# Patient Record
Sex: Female | Born: 1935 | Race: White | Hispanic: No | Smoking: Current every day smoker
Health system: Southern US, Community
[De-identification: ages and names within clinical notes are randomized; demographics above are authoritative.]

## PROBLEM LIST (undated history)

## (undated) DIAGNOSIS — C3490 Malignant neoplasm of unspecified part of unspecified bronchus or lung: Secondary | ICD-10-CM

## (undated) DIAGNOSIS — I10 Essential (primary) hypertension: Secondary | ICD-10-CM

## (undated) HISTORY — DX: Malignant neoplasm of unspecified part of unspecified bronchus or lung: C34.90

---

## 2005-01-02 ENCOUNTER — Ambulatory Visit: Payer: Self-pay | Admitting: Internal Medicine

## 2005-10-15 ENCOUNTER — Ambulatory Visit: Payer: Self-pay | Admitting: Ophthalmology

## 2005-10-22 ENCOUNTER — Ambulatory Visit: Payer: Self-pay | Admitting: Ophthalmology

## 2005-12-13 ENCOUNTER — Ambulatory Visit: Payer: Self-pay | Admitting: Ophthalmology

## 2005-12-19 ENCOUNTER — Ambulatory Visit: Payer: Self-pay | Admitting: Ophthalmology

## 2006-02-19 ENCOUNTER — Ambulatory Visit: Payer: Self-pay | Admitting: Internal Medicine

## 2006-04-16 ENCOUNTER — Ambulatory Visit: Payer: Self-pay | Admitting: Gastroenterology

## 2007-02-21 ENCOUNTER — Ambulatory Visit: Payer: Self-pay | Admitting: Internal Medicine

## 2008-03-02 ENCOUNTER — Ambulatory Visit: Payer: Self-pay | Admitting: Internal Medicine

## 2009-03-18 ENCOUNTER — Ambulatory Visit: Payer: Self-pay | Admitting: Internal Medicine

## 2010-04-05 ENCOUNTER — Ambulatory Visit: Payer: Self-pay | Admitting: Internal Medicine

## 2011-05-03 ENCOUNTER — Ambulatory Visit: Payer: Self-pay | Admitting: Internal Medicine

## 2011-11-19 ENCOUNTER — Ambulatory Visit: Payer: Self-pay | Admitting: Gastroenterology

## 2012-03-21 ENCOUNTER — Ambulatory Visit: Payer: Self-pay | Admitting: Internal Medicine

## 2012-05-05 ENCOUNTER — Ambulatory Visit: Payer: Self-pay | Admitting: Internal Medicine

## 2012-08-11 ENCOUNTER — Other Ambulatory Visit: Payer: Self-pay | Admitting: Gastroenterology

## 2012-08-13 LAB — WBCS, STOOL

## 2012-10-01 ENCOUNTER — Ambulatory Visit: Payer: Self-pay | Admitting: Internal Medicine

## 2013-05-06 ENCOUNTER — Ambulatory Visit: Payer: Self-pay | Admitting: Internal Medicine

## 2013-10-02 ENCOUNTER — Ambulatory Visit: Payer: Self-pay | Admitting: Internal Medicine

## 2013-10-13 ENCOUNTER — Ambulatory Visit: Payer: Self-pay | Admitting: Internal Medicine

## 2013-10-29 ENCOUNTER — Ambulatory Visit: Payer: Self-pay | Admitting: Internal Medicine

## 2013-11-05 ENCOUNTER — Ambulatory Visit: Payer: Self-pay | Admitting: Internal Medicine

## 2013-11-16 ENCOUNTER — Ambulatory Visit: Payer: Self-pay | Admitting: Oncology

## 2013-11-16 LAB — COMPREHENSIVE METABOLIC PANEL
Albumin: 4 g/dL (ref 3.4–5.0)
Alkaline Phosphatase: 78 U/L
Anion Gap: 7 (ref 7–16)
BUN: 10 mg/dL (ref 7–18)
Bilirubin,Total: 0.4 mg/dL (ref 0.2–1.0)
CALCIUM: 9.2 mg/dL (ref 8.5–10.1)
CO2: 30 mmol/L (ref 21–32)
Chloride: 103 mmol/L (ref 98–107)
Creatinine: 1.03 mg/dL (ref 0.60–1.30)
EGFR (Non-African Amer.): 53 — ABNORMAL LOW
GLUCOSE: 118 mg/dL — AB (ref 65–99)
Osmolality: 280 (ref 275–301)
Potassium: 4.3 mmol/L (ref 3.5–5.1)
SGOT(AST): 25 U/L (ref 15–37)
SGPT (ALT): 29 U/L
SODIUM: 140 mmol/L (ref 136–145)
Total Protein: 7.6 g/dL (ref 6.4–8.2)

## 2013-11-16 LAB — CBC CANCER CENTER
BASOS ABS: 0.1 x10 3/mm (ref 0.0–0.1)
Basophil %: 0.7 %
Eosinophil #: 0.2 x10 3/mm (ref 0.0–0.7)
Eosinophil %: 2.3 %
HCT: 45.6 % (ref 35.0–47.0)
HGB: 15 g/dL (ref 12.0–16.0)
Lymphocyte #: 2.4 x10 3/mm (ref 1.0–3.6)
Lymphocyte %: 32.4 %
MCH: 30.5 pg (ref 26.0–34.0)
MCHC: 33 g/dL (ref 32.0–36.0)
MCV: 93 fL (ref 80–100)
Monocyte #: 0.6 x10 3/mm (ref 0.2–0.9)
Monocyte %: 8.3 %
NEUTROS ABS: 4.1 x10 3/mm (ref 1.4–6.5)
Neutrophil %: 56.3 %
Platelet: 307 x10 3/mm (ref 150–440)
RBC: 4.93 10*6/uL (ref 3.80–5.20)
RDW: 14.2 % (ref 11.5–14.5)
WBC: 7.3 x10 3/mm (ref 3.6–11.0)

## 2013-11-23 ENCOUNTER — Ambulatory Visit: Payer: Self-pay | Admitting: Vascular Surgery

## 2013-12-02 LAB — CBC CANCER CENTER
BASOS ABS: 0.1 x10 3/mm (ref 0.0–0.1)
BASOS PCT: 1.1 %
Eosinophil #: 0.2 x10 3/mm (ref 0.0–0.7)
Eosinophil %: 4.1 %
HCT: 42.5 % (ref 35.0–47.0)
HGB: 14.1 g/dL (ref 12.0–16.0)
Lymphocyte #: 1.6 x10 3/mm (ref 1.0–3.6)
Lymphocyte %: 29.7 %
MCH: 30.8 pg (ref 26.0–34.0)
MCHC: 33.1 g/dL (ref 32.0–36.0)
MCV: 93 fL (ref 80–100)
MONO ABS: 0.3 x10 3/mm (ref 0.2–0.9)
Monocyte %: 5.8 %
NEUTROS ABS: 3.2 x10 3/mm (ref 1.4–6.5)
Neutrophil %: 59.3 %
Platelet: 280 x10 3/mm (ref 150–440)
RBC: 4.57 10*6/uL (ref 3.80–5.20)
RDW: 13.5 % (ref 11.5–14.5)
WBC: 5.4 x10 3/mm (ref 3.6–11.0)

## 2013-12-08 ENCOUNTER — Ambulatory Visit: Payer: Self-pay | Admitting: Oncology

## 2013-12-09 LAB — CBC CANCER CENTER
Basophil #: 0 x10 3/mm (ref 0.0–0.1)
Basophil %: 0.9 %
Eosinophil #: 0.1 x10 3/mm (ref 0.0–0.7)
Eosinophil %: 2.5 %
HCT: 39.4 % (ref 35.0–47.0)
HGB: 13.1 g/dL (ref 12.0–16.0)
LYMPHS ABS: 1.5 x10 3/mm (ref 1.0–3.6)
Lymphocyte %: 31.9 %
MCH: 31.1 pg (ref 26.0–34.0)
MCHC: 33.3 g/dL (ref 32.0–36.0)
MCV: 94 fL (ref 80–100)
Monocyte #: 0.4 x10 3/mm (ref 0.2–0.9)
Monocyte %: 8.8 %
NEUTROS ABS: 2.7 x10 3/mm (ref 1.4–6.5)
Neutrophil %: 55.9 %
PLATELETS: 264 x10 3/mm (ref 150–440)
RBC: 4.21 10*6/uL (ref 3.80–5.20)
RDW: 13.3 % (ref 11.5–14.5)
WBC: 4.7 x10 3/mm (ref 3.6–11.0)

## 2013-12-16 LAB — CBC CANCER CENTER
BASOS PCT: 0.9 %
Basophil #: 0.1 x10 3/mm (ref 0.0–0.1)
EOS PCT: 1.7 %
Eosinophil #: 0.1 x10 3/mm (ref 0.0–0.7)
HCT: 42.1 % (ref 35.0–47.0)
HGB: 14.2 g/dL (ref 12.0–16.0)
LYMPHS ABS: 1.4 x10 3/mm (ref 1.0–3.6)
Lymphocyte %: 25 %
MCH: 31.5 pg (ref 26.0–34.0)
MCHC: 33.7 g/dL (ref 32.0–36.0)
MCV: 93 fL (ref 80–100)
MONOS PCT: 8.9 %
Monocyte #: 0.5 x10 3/mm (ref 0.2–0.9)
Neutrophil #: 3.6 x10 3/mm (ref 1.4–6.5)
Neutrophil %: 63.5 %
PLATELETS: 240 x10 3/mm (ref 150–440)
RBC: 4.51 10*6/uL (ref 3.80–5.20)
RDW: 14.2 % (ref 11.5–14.5)
WBC: 5.6 x10 3/mm (ref 3.6–11.0)

## 2013-12-16 LAB — COMPREHENSIVE METABOLIC PANEL
ALK PHOS: 75 U/L
ALT: 23 U/L
ANION GAP: 10 (ref 7–16)
AST: 22 U/L (ref 15–37)
Albumin: 3.5 g/dL (ref 3.4–5.0)
BILIRUBIN TOTAL: 0.4 mg/dL (ref 0.2–1.0)
BUN: 7 mg/dL (ref 7–18)
CALCIUM: 8.7 mg/dL (ref 8.5–10.1)
CHLORIDE: 102 mmol/L (ref 98–107)
Co2: 27 mmol/L (ref 21–32)
Creatinine: 0.96 mg/dL (ref 0.60–1.30)
EGFR (Non-African Amer.): 57 — ABNORMAL LOW
Glucose: 100 mg/dL — ABNORMAL HIGH (ref 65–99)
OSMOLALITY: 276 (ref 275–301)
POTASSIUM: 3.7 mmol/L (ref 3.5–5.1)
SODIUM: 139 mmol/L (ref 136–145)
Total Protein: 6.9 g/dL (ref 6.4–8.2)

## 2013-12-18 LAB — PATHOLOGY REPORT

## 2013-12-23 LAB — CBC CANCER CENTER
BASOS ABS: 0.1 x10 3/mm (ref 0.0–0.1)
Basophil %: 1.3 %
EOS ABS: 0.1 x10 3/mm (ref 0.0–0.7)
EOS PCT: 2.1 %
HCT: 39.7 % (ref 35.0–47.0)
HGB: 13 g/dL (ref 12.0–16.0)
LYMPHS ABS: 2.3 x10 3/mm (ref 1.0–3.6)
LYMPHS PCT: 38.5 %
MCH: 30.7 pg (ref 26.0–34.0)
MCHC: 32.9 g/dL (ref 32.0–36.0)
MCV: 93 fL (ref 80–100)
Monocyte #: 0.3 x10 3/mm (ref 0.2–0.9)
Monocyte %: 5 %
NEUTROS PCT: 53.1 %
Neutrophil #: 3.2 x10 3/mm (ref 1.4–6.5)
PLATELETS: 187 x10 3/mm (ref 150–440)
RBC: 4.25 10*6/uL (ref 3.80–5.20)
RDW: 13.9 % (ref 11.5–14.5)
WBC: 6 x10 3/mm (ref 3.6–11.0)

## 2014-01-06 LAB — COMPREHENSIVE METABOLIC PANEL
AST: 18 U/L (ref 15–37)
Albumin: 3.6 g/dL (ref 3.4–5.0)
Alkaline Phosphatase: 75 U/L
Anion Gap: 9 (ref 7–16)
BUN: 11 mg/dL (ref 7–18)
Bilirubin,Total: 0.3 mg/dL (ref 0.2–1.0)
CHLORIDE: 103 mmol/L (ref 98–107)
CREATININE: 1 mg/dL (ref 0.60–1.30)
Calcium, Total: 9 mg/dL (ref 8.5–10.1)
Co2: 28 mmol/L (ref 21–32)
EGFR (African American): >60
EGFR (Non-African Amer.): 57 — ABNORMAL LOW
Glucose: 102 mg/dL — ABNORMAL HIGH (ref 65–99)
OSMOLALITY: 279 (ref 275–301)
Potassium: 3.8 mmol/L (ref 3.5–5.1)
SGPT (ALT): 26 U/L
SODIUM: 140 mmol/L (ref 136–145)
Total Protein: 6.6 g/dL (ref 6.4–8.2)

## 2014-01-06 LAB — CBC CANCER CENTER
BASOS PCT: 0.9 %
Basophil #: 0 x10 3/mm (ref 0.0–0.1)
Eosinophil #: 0.1 x10 3/mm (ref 0.0–0.7)
Eosinophil %: 1.6 %
HCT: 38.9 % (ref 35.0–47.0)
HGB: 12.8 g/dL (ref 12.0–16.0)
LYMPHS PCT: 29.4 %
Lymphocyte #: 1.5 x10 3/mm (ref 1.0–3.6)
MCH: 31.3 pg (ref 26.0–34.0)
MCHC: 33 g/dL (ref 32.0–36.0)
MCV: 95 fL (ref 80–100)
Monocyte #: 0.5 x10 3/mm (ref 0.2–0.9)
Monocyte %: 10.6 %
Neutrophil #: 2.9 x10 3/mm (ref 1.4–6.5)
Neutrophil %: 57.5 %
Platelet: 195 x10 3/mm (ref 150–440)
RBC: 4.1 10*6/uL (ref 3.80–5.20)
RDW: 15.4 % — ABNORMAL HIGH (ref 11.5–14.5)
WBC: 5 x10 3/mm (ref 3.6–11.0)

## 2014-01-06 LAB — MAGNESIUM: Magnesium: 1.8 mg/dL

## 2014-01-07 ENCOUNTER — Ambulatory Visit: Payer: Self-pay | Admitting: Oncology

## 2014-01-13 LAB — CBC CANCER CENTER
BASOS ABS: 0 x10 3/mm (ref 0.0–0.1)
BASOS PCT: 1 %
EOS PCT: 1.9 %
Eosinophil #: 0.1 x10 3/mm (ref 0.0–0.7)
HCT: 35.8 % (ref 35.0–47.0)
HGB: 12.1 g/dL (ref 12.0–16.0)
Lymphocyte #: 2.1 x10 3/mm (ref 1.0–3.6)
Lymphocyte %: 45.5 %
MCH: 32.3 pg (ref 26.0–34.0)
MCHC: 33.9 g/dL (ref 32.0–36.0)
MCV: 95 fL (ref 80–100)
MONO ABS: 0.2 x10 3/mm (ref 0.2–0.9)
Monocyte %: 5.1 %
NEUTROS PCT: 46.5 %
Neutrophil #: 2.1 x10 3/mm (ref 1.4–6.5)
Platelet: 203 x10 3/mm (ref 150–440)
RBC: 3.75 10*6/uL — ABNORMAL LOW (ref 3.80–5.20)
RDW: 15.5 % — AB (ref 11.5–14.5)
WBC: 4.6 x10 3/mm (ref 3.6–11.0)

## 2014-01-28 ENCOUNTER — Ambulatory Visit: Payer: Self-pay | Admitting: Oncology

## 2014-02-02 LAB — CBC CANCER CENTER
Basophil #: 0 x10 3/mm (ref 0.0–0.1)
Basophil %: 1 %
Eosinophil #: 0.1 x10 3/mm (ref 0.0–0.7)
Eosinophil %: 1.5 %
HCT: 39 % (ref 35.0–47.0)
HGB: 13.1 g/dL (ref 12.0–16.0)
LYMPHS PCT: 29.9 %
Lymphocyte #: 1.3 x10 3/mm (ref 1.0–3.6)
MCH: 33.1 pg (ref 26.0–34.0)
MCHC: 33.7 g/dL (ref 32.0–36.0)
MCV: 98 fL (ref 80–100)
MONO ABS: 0.5 x10 3/mm (ref 0.2–0.9)
Monocyte %: 11.2 %
NEUTROS ABS: 2.5 x10 3/mm (ref 1.4–6.5)
NEUTROS PCT: 56.4 %
Platelet: 242 x10 3/mm (ref 150–440)
RBC: 3.96 10*6/uL (ref 3.80–5.20)
RDW: 18.3 % — ABNORMAL HIGH (ref 11.5–14.5)
WBC: 4.4 x10 3/mm (ref 3.6–11.0)

## 2014-02-02 LAB — COMPREHENSIVE METABOLIC PANEL
ALBUMIN: 3.7 g/dL (ref 3.4–5.0)
ALK PHOS: 77 U/L
Anion Gap: 7 (ref 7–16)
BILIRUBIN TOTAL: 0.5 mg/dL (ref 0.2–1.0)
BUN: 10 mg/dL (ref 7–18)
CHLORIDE: 103 mmol/L (ref 98–107)
Calcium, Total: 9.2 mg/dL (ref 8.5–10.1)
Co2: 30 mmol/L (ref 21–32)
Creatinine: 0.9 mg/dL (ref 0.60–1.30)
Glucose: 89 mg/dL (ref 65–99)
Osmolality: 278 (ref 275–301)
Potassium: 3.9 mmol/L (ref 3.5–5.1)
SGOT(AST): 20 U/L (ref 15–37)
SGPT (ALT): 24 U/L
Sodium: 140 mmol/L (ref 136–145)
Total Protein: 7 g/dL (ref 6.4–8.2)

## 2014-02-02 LAB — MAGNESIUM: Magnesium: 1.8 mg/dL

## 2014-02-07 ENCOUNTER — Ambulatory Visit: Payer: Self-pay | Admitting: Oncology

## 2014-02-09 LAB — CBC CANCER CENTER
BASOS PCT: 1 %
Basophil #: 0.1 x10 3/mm (ref 0.0–0.1)
EOS ABS: 0.1 x10 3/mm (ref 0.0–0.7)
Eosinophil %: 1.6 %
HCT: 33.6 % — AB (ref 35.0–47.0)
HGB: 11.2 g/dL — ABNORMAL LOW (ref 12.0–16.0)
LYMPHS PCT: 34.1 %
Lymphocyte #: 1.8 x10 3/mm (ref 1.0–3.6)
MCH: 33.1 pg (ref 26.0–34.0)
MCHC: 33.2 g/dL (ref 32.0–36.0)
MCV: 100 fL (ref 80–100)
MONO ABS: 0.3 x10 3/mm (ref 0.2–0.9)
MONOS PCT: 5.8 %
NEUTROS PCT: 57.5 %
Neutrophil #: 3 x10 3/mm (ref 1.4–6.5)
Platelet: 257 x10 3/mm (ref 150–440)
RBC: 3.38 10*6/uL — AB (ref 3.80–5.20)
RDW: 18.2 % — AB (ref 11.5–14.5)
WBC: 5.3 x10 3/mm (ref 3.6–11.0)

## 2014-02-23 LAB — CBC CANCER CENTER
Basophil #: 0 x10 3/mm (ref 0.0–0.1)
Basophil %: 1 %
EOS ABS: 0 x10 3/mm (ref 0.0–0.7)
Eosinophil %: 0.5 %
HCT: 37.1 % (ref 35.0–47.0)
HGB: 12.2 g/dL (ref 12.0–16.0)
LYMPHS PCT: 32.1 %
Lymphocyte #: 1.5 x10 3/mm (ref 1.0–3.6)
MCH: 33.5 pg (ref 26.0–34.0)
MCHC: 32.9 g/dL (ref 32.0–36.0)
MCV: 102 fL — AB (ref 80–100)
MONOS PCT: 10.3 %
Monocyte #: 0.5 x10 3/mm (ref 0.2–0.9)
NEUTROS ABS: 2.6 x10 3/mm (ref 1.4–6.5)
Neutrophil %: 56.1 %
Platelet: 159 x10 3/mm (ref 150–440)
RBC: 3.65 10*6/uL — AB (ref 3.80–5.20)
RDW: 18.8 % — ABNORMAL HIGH (ref 11.5–14.5)
WBC: 4.6 x10 3/mm (ref 3.6–11.0)

## 2014-02-23 LAB — COMPREHENSIVE METABOLIC PANEL
ANION GAP: 8 (ref 7–16)
Albumin: 3.6 g/dL (ref 3.4–5.0)
Alkaline Phosphatase: 72 U/L
BILIRUBIN TOTAL: 0.4 mg/dL (ref 0.2–1.0)
BUN: 8 mg/dL (ref 7–18)
CALCIUM: 9.3 mg/dL (ref 8.5–10.1)
CHLORIDE: 104 mmol/L (ref 98–107)
CO2: 30 mmol/L (ref 21–32)
CREATININE: 0.83 mg/dL (ref 0.60–1.30)
EGFR (African American): >60
Glucose: 86 mg/dL (ref 65–99)
OSMOLALITY: 281 (ref 275–301)
Potassium: 3.9 mmol/L (ref 3.5–5.1)
SGOT(AST): 17 U/L (ref 15–37)
SGPT (ALT): 22 U/L
SODIUM: 142 mmol/L (ref 136–145)
TOTAL PROTEIN: 6.7 g/dL (ref 6.4–8.2)

## 2014-03-02 LAB — CBC CANCER CENTER
BASOS ABS: 0.1 x10 3/mm (ref 0.0–0.1)
BASOS PCT: 1 %
Eosinophil #: 0.1 x10 3/mm (ref 0.0–0.7)
Eosinophil %: 1.1 %
HCT: 34.2 % — ABNORMAL LOW (ref 35.0–47.0)
HGB: 11.5 g/dL — ABNORMAL LOW (ref 12.0–16.0)
LYMPHS ABS: 1.7 x10 3/mm (ref 1.0–3.6)
LYMPHS PCT: 33.4 %
MCH: 34.3 pg — AB (ref 26.0–34.0)
MCHC: 33.6 g/dL (ref 32.0–36.0)
MCV: 102 fL — ABNORMAL HIGH (ref 80–100)
MONO ABS: 0.2 x10 3/mm (ref 0.2–0.9)
MONOS PCT: 4.5 %
NEUTROS ABS: 3.1 x10 3/mm (ref 1.4–6.5)
Neutrophil %: 60 %
PLATELETS: 146 x10 3/mm — AB (ref 150–440)
RBC: 3.34 10*6/uL — AB (ref 3.80–5.20)
RDW: 17.8 % — ABNORMAL HIGH (ref 11.5–14.5)
WBC: 5.2 x10 3/mm (ref 3.6–11.0)

## 2014-03-09 ENCOUNTER — Ambulatory Visit: Payer: Self-pay | Admitting: Oncology

## 2014-03-16 LAB — COMPREHENSIVE METABOLIC PANEL
ALBUMIN: 3.5 g/dL (ref 3.4–5.0)
ALK PHOS: 75 U/L
Anion Gap: 6 — ABNORMAL LOW (ref 7–16)
BILIRUBIN TOTAL: 0.4 mg/dL (ref 0.2–1.0)
BUN: 8 mg/dL (ref 7–18)
CALCIUM: 8.9 mg/dL (ref 8.5–10.1)
CHLORIDE: 107 mmol/L (ref 98–107)
CO2: 28 mmol/L (ref 21–32)
Creatinine: 0.9 mg/dL (ref 0.60–1.30)
EGFR (African American): >60
Glucose: 93 mg/dL (ref 65–99)
Osmolality: 279 (ref 275–301)
POTASSIUM: 3.3 mmol/L — AB (ref 3.5–5.1)
SGOT(AST): 19 U/L (ref 15–37)
SGPT (ALT): 22 U/L
SODIUM: 141 mmol/L (ref 136–145)
TOTAL PROTEIN: 6.6 g/dL (ref 6.4–8.2)

## 2014-03-16 LAB — CBC CANCER CENTER
Basophil #: 0 x10 3/mm (ref 0.0–0.1)
Basophil %: 0.7 %
EOS ABS: 0 x10 3/mm (ref 0.0–0.7)
EOS PCT: 0.6 %
HCT: 33.5 % — ABNORMAL LOW (ref 35.0–47.0)
HGB: 11.1 g/dL — AB (ref 12.0–16.0)
LYMPHS ABS: 1.1 x10 3/mm (ref 1.0–3.6)
Lymphocyte %: 33.6 %
MCH: 35.1 pg — ABNORMAL HIGH (ref 26.0–34.0)
MCHC: 33.1 g/dL (ref 32.0–36.0)
MCV: 106 fL — AB (ref 80–100)
MONO ABS: 0.5 x10 3/mm (ref 0.2–0.9)
Monocyte %: 13.8 %
Neutrophil #: 1.8 x10 3/mm (ref 1.4–6.5)
Neutrophil %: 51.3 %
PLATELETS: 155 x10 3/mm (ref 150–440)
RBC: 3.16 10*6/uL — ABNORMAL LOW (ref 3.80–5.20)
RDW: 17.9 % — ABNORMAL HIGH (ref 11.5–14.5)
WBC: 3.4 x10 3/mm — AB (ref 3.6–11.0)

## 2014-03-23 LAB — CBC CANCER CENTER
Basophil #: 0 x10 3/mm (ref 0.0–0.1)
Basophil %: 0.6 %
EOS ABS: 0.1 x10 3/mm (ref 0.0–0.7)
Eosinophil %: 1.3 %
HCT: 30.9 % — AB (ref 35.0–47.0)
HGB: 10.5 g/dL — AB (ref 12.0–16.0)
LYMPHS ABS: 1.4 x10 3/mm (ref 1.0–3.6)
Lymphocyte %: 32 %
MCH: 34.9 pg — AB (ref 26.0–34.0)
MCHC: 34 g/dL (ref 32.0–36.0)
MCV: 103 fL — ABNORMAL HIGH (ref 80–100)
MONOS PCT: 7.8 %
Monocyte #: 0.3 x10 3/mm (ref 0.2–0.9)
NEUTROS ABS: 2.6 x10 3/mm (ref 1.4–6.5)
Neutrophil %: 58.3 %
Platelet: 167 x10 3/mm (ref 150–440)
RBC: 3.01 10*6/uL — AB (ref 3.80–5.20)
RDW: 17 % — ABNORMAL HIGH (ref 11.5–14.5)
WBC: 4.4 x10 3/mm (ref 3.6–11.0)

## 2014-03-24 DIAGNOSIS — C787 Secondary malignant neoplasm of liver and intrahepatic bile duct: Secondary | ICD-10-CM | POA: Insufficient documentation

## 2014-03-24 DIAGNOSIS — C3410 Malignant neoplasm of upper lobe, unspecified bronchus or lung: Secondary | ICD-10-CM | POA: Insufficient documentation

## 2014-03-30 LAB — BASIC METABOLIC PANEL
Anion Gap: 8 (ref 7–16)
BUN: 8 mg/dL (ref 7–18)
CHLORIDE: 103 mmol/L (ref 98–107)
Calcium, Total: 8.8 mg/dL (ref 8.5–10.1)
Co2: 29 mmol/L (ref 21–32)
Creatinine: 0.8 mg/dL (ref 0.60–1.30)
EGFR (African American): >60
EGFR (Non-African Amer.): >60
Glucose: 89 mg/dL (ref 65–99)
Osmolality: 277 (ref 275–301)
POTASSIUM: 3.6 mmol/L (ref 3.5–5.1)
Sodium: 140 mmol/L (ref 136–145)

## 2014-03-30 LAB — HEPATIC FUNCTION PANEL A (ARMC)
ALT: 24 U/L
Albumin: 3.6 g/dL (ref 3.4–5.0)
Alkaline Phosphatase: 77 U/L
BILIRUBIN DIRECT: 0.1 mg/dL (ref 0.0–0.2)
Bilirubin,Total: 0.3 mg/dL (ref 0.2–1.0)
SGOT(AST): 19 U/L (ref 15–37)
Total Protein: 6.9 g/dL (ref 6.4–8.2)

## 2014-03-30 LAB — CBC CANCER CENTER
BASOS ABS: 0 x10 3/mm (ref 0.0–0.1)
BASOS PCT: 0.6 %
Eosinophil #: 0 x10 3/mm (ref 0.0–0.7)
Eosinophil %: 1.1 %
HCT: 33 % — ABNORMAL LOW (ref 35.0–47.0)
HGB: 11.2 g/dL — ABNORMAL LOW (ref 12.0–16.0)
Lymphocyte #: 1.5 x10 3/mm (ref 1.0–3.6)
Lymphocyte %: 48.5 %
MCH: 34.9 pg — AB (ref 26.0–34.0)
MCHC: 33.8 g/dL (ref 32.0–36.0)
MCV: 103 fL — ABNORMAL HIGH (ref 80–100)
Monocyte #: 0.3 x10 3/mm (ref 0.2–0.9)
Monocyte %: 10 %
Neutrophil #: 1.2 x10 3/mm — ABNORMAL LOW (ref 1.4–6.5)
Neutrophil %: 39.8 %
PLATELETS: 166 x10 3/mm (ref 150–440)
RBC: 3.2 10*6/uL — AB (ref 3.80–5.20)
RDW: 16.5 % — ABNORMAL HIGH (ref 11.5–14.5)
WBC: 3.1 x10 3/mm — ABNORMAL LOW (ref 3.6–11.0)

## 2014-04-06 ENCOUNTER — Ambulatory Visit: Payer: Self-pay | Admitting: Oncology

## 2014-04-09 ENCOUNTER — Ambulatory Visit: Payer: Self-pay | Admitting: Oncology

## 2014-04-13 LAB — CBC CANCER CENTER
BASOS PCT: 0.8 %
Basophil #: 0 x10 3/mm (ref 0.0–0.1)
EOS ABS: 0.1 x10 3/mm (ref 0.0–0.7)
Eosinophil %: 1.3 %
HCT: 37.6 % (ref 35.0–47.0)
HGB: 12.6 g/dL (ref 12.0–16.0)
LYMPHS ABS: 1.7 x10 3/mm (ref 1.0–3.6)
Lymphocyte %: 32.4 %
MCH: 35.2 pg — ABNORMAL HIGH (ref 26.0–34.0)
MCHC: 33.7 g/dL (ref 32.0–36.0)
MCV: 105 fL — ABNORMAL HIGH (ref 80–100)
MONO ABS: 0.6 x10 3/mm (ref 0.2–0.9)
Monocyte %: 10.9 %
Neutrophil #: 2.9 x10 3/mm (ref 1.4–6.5)
Neutrophil %: 54.6 %
Platelet: 268 x10 3/mm (ref 150–440)
RBC: 3.59 10*6/uL — ABNORMAL LOW (ref 3.80–5.20)
RDW: 16.3 % — AB (ref 11.5–14.5)
WBC: 5.2 x10 3/mm (ref 3.6–11.0)

## 2014-04-13 LAB — COMPREHENSIVE METABOLIC PANEL
ALK PHOS: 88 U/L
ALT: 21 U/L
ANION GAP: 7 (ref 7–16)
Albumin: 3.7 g/dL (ref 3.4–5.0)
BUN: 8 mg/dL (ref 7–18)
Bilirubin,Total: 0.3 mg/dL (ref 0.2–1.0)
CHLORIDE: 102 mmol/L (ref 98–107)
CREATININE: 0.81 mg/dL (ref 0.60–1.30)
Calcium, Total: 9 mg/dL (ref 8.5–10.1)
Co2: 31 mmol/L (ref 21–32)
EGFR (Non-African Amer.): >60
Glucose: 95 mg/dL (ref 65–99)
Osmolality: 278 (ref 275–301)
Potassium: 4.4 mmol/L (ref 3.5–5.1)
SGOT(AST): 17 U/L (ref 15–37)
SODIUM: 140 mmol/L (ref 136–145)
Total Protein: 7.3 g/dL (ref 6.4–8.2)

## 2014-04-13 LAB — MAGNESIUM: Magnesium: 1.8 mg/dL

## 2014-04-27 LAB — COMPREHENSIVE METABOLIC PANEL
Albumin: 3.7 g/dL (ref 3.4–5.0)
Alkaline Phosphatase: 86 U/L
Anion Gap: 8 (ref 7–16)
BUN: 5 mg/dL — ABNORMAL LOW (ref 7–18)
Bilirubin,Total: 0.4 mg/dL (ref 0.2–1.0)
CALCIUM: 8.7 mg/dL (ref 8.5–10.1)
CO2: 29 mmol/L (ref 21–32)
Chloride: 103 mmol/L (ref 98–107)
Creatinine: 0.88 mg/dL (ref 0.60–1.30)
EGFR (African American): >60
Glucose: 108 mg/dL — ABNORMAL HIGH (ref 65–99)
Osmolality: 277 (ref 275–301)
POTASSIUM: 3.3 mmol/L — AB (ref 3.5–5.1)
SGOT(AST): 19 U/L (ref 15–37)
SGPT (ALT): 21 U/L
SODIUM: 140 mmol/L (ref 136–145)
Total Protein: 7 g/dL (ref 6.4–8.2)

## 2014-04-27 LAB — CBC CANCER CENTER
BASOS PCT: 0.7 %
Basophil #: 0 x10 3/mm (ref 0.0–0.1)
EOS PCT: 1 %
Eosinophil #: 0.1 x10 3/mm (ref 0.0–0.7)
HCT: 38.8 % (ref 35.0–47.0)
HGB: 13.1 g/dL (ref 12.0–16.0)
LYMPHS ABS: 1.5 x10 3/mm (ref 1.0–3.6)
Lymphocyte %: 23 %
MCH: 34.8 pg — ABNORMAL HIGH (ref 26.0–34.0)
MCHC: 33.8 g/dL (ref 32.0–36.0)
MCV: 103 fL — AB (ref 80–100)
MONO ABS: 0.5 x10 3/mm (ref 0.2–0.9)
MONOS PCT: 8.5 %
Neutrophil #: 4.3 x10 3/mm (ref 1.4–6.5)
Neutrophil %: 66.8 %
Platelet: 240 x10 3/mm (ref 150–440)
RBC: 3.77 10*6/uL — AB (ref 3.80–5.20)
RDW: 15.5 % — ABNORMAL HIGH (ref 11.5–14.5)
WBC: 6.4 x10 3/mm (ref 3.6–11.0)

## 2014-05-10 ENCOUNTER — Ambulatory Visit: Payer: Self-pay | Admitting: Oncology

## 2014-06-08 ENCOUNTER — Ambulatory Visit
Admit: 2014-06-08 | Disposition: A | Discharge status: Home / Self Care | Payer: Self-pay | Attending: Oncology | Admitting: Oncology

## 2014-07-06 LAB — COMPREHENSIVE METABOLIC PANEL
AST: 22 U/L
Albumin: 4.2 g/dL
Alkaline Phosphatase: 64 U/L
Anion Gap: 3 — ABNORMAL LOW (ref 7–16)
BUN: 13 mg/dL
Bilirubin,Total: 0.6 mg/dL
CALCIUM: 8.9 mg/dL
Chloride: 104 mmol/L
Co2: 26 mmol/L
Creatinine: 0.91 mg/dL
EGFR (Non-African Amer.): >60
Glucose: 97 mg/dL
POTASSIUM: 3.9 mmol/L
SGPT (ALT): 15 U/L
Sodium: 133 mmol/L — ABNORMAL LOW
Total Protein: 7 g/dL

## 2014-07-06 LAB — CREATINE: Creat: 0.91

## 2014-07-06 LAB — CBC CANCER CENTER
BASOS PCT: 0.5 %
Basophil #: 0 x10 3/mm (ref 0.0–0.1)
EOS PCT: 0.7 %
Eosinophil #: 0 x10 3/mm (ref 0.0–0.7)
HCT: 41.8 % (ref 35.0–47.0)
HGB: 14 g/dL (ref 12.0–16.0)
LYMPHS PCT: 25.7 %
Lymphocyte #: 1.6 x10 3/mm (ref 1.0–3.6)
MCH: 31.4 pg (ref 26.0–34.0)
MCHC: 33.4 g/dL (ref 32.0–36.0)
MCV: 94 fL (ref 80–100)
MONO ABS: 0.9 x10 3/mm (ref 0.2–0.9)
MONOS PCT: 13.9 %
NEUTROS PCT: 59.2 %
Neutrophil #: 3.7 x10 3/mm (ref 1.4–6.5)
PLATELETS: 234 x10 3/mm (ref 150–440)
RBC: 4.45 10*6/uL (ref 3.80–5.20)
RDW: 13.4 % (ref 11.5–14.5)
WBC: 6.2 x10 3/mm (ref 3.6–11.0)

## 2014-07-06 LAB — TSH: THYROID STIMULATING HORM: 1.206 u[IU]/mL

## 2014-07-06 LAB — MAGNESIUM: Magnesium: 1.9 mg/dL

## 2014-07-09 ENCOUNTER — Ambulatory Visit
Admit: 2014-07-09 | Disposition: A | Discharge status: Home / Self Care | Payer: Self-pay | Attending: Oncology | Admitting: Oncology

## 2014-07-15 LAB — BASIC METABOLIC PANEL
Anion Gap: 5 — ABNORMAL LOW (ref 7–16)
BUN: 13 mg/dL
CALCIUM: 8.7 mg/dL — AB
CO2: 28 mmol/L
Chloride: 103 mmol/L
Creatinine: 0.83 mg/dL
EGFR (African American): >60
EGFR (Non-African Amer.): >60
Glucose: 94 mg/dL
Potassium: 3.1 mmol/L — ABNORMAL LOW
Sodium: 136 mmol/L

## 2014-07-15 LAB — CBC CANCER CENTER
Basophil #: 0 x10 3/mm (ref 0.0–0.1)
Basophil %: 0.2 %
EOS ABS: 0.1 x10 3/mm (ref 0.0–0.7)
Eosinophil %: 1.3 %
HCT: 41.3 % (ref 35.0–47.0)
HGB: 13.8 g/dL (ref 12.0–16.0)
LYMPHS PCT: 24.2 %
Lymphocyte #: 2.8 x10 3/mm (ref 1.0–3.6)
MCH: 30.9 pg (ref 26.0–34.0)
MCHC: 33.4 g/dL (ref 32.0–36.0)
MCV: 93 fL (ref 80–100)
MONO ABS: 0.9 x10 3/mm (ref 0.2–0.9)
Monocyte %: 8 %
NEUTROS PCT: 66.3 %
Neutrophil #: 7.6 x10 3/mm — ABNORMAL HIGH (ref 1.4–6.5)
Platelet: 281 x10 3/mm (ref 150–440)
RBC: 4.45 10*6/uL (ref 3.80–5.20)
RDW: 13.6 % (ref 11.5–14.5)
WBC: 11.5 x10 3/mm — ABNORMAL HIGH (ref 3.6–11.0)

## 2014-07-15 LAB — MAGNESIUM: Magnesium: 1.8 mg/dL

## 2014-07-21 LAB — COMPREHENSIVE METABOLIC PANEL
ALBUMIN: 3.6 g/dL
Alkaline Phosphatase: 60 U/L
Anion Gap: 5 — ABNORMAL LOW (ref 7–16)
BUN: 14 mg/dL
Bilirubin,Total: 0.5 mg/dL
CALCIUM: 8.7 mg/dL — AB
CO2: 27 mmol/L
Chloride: 101 mmol/L
Creatinine: 1.01 mg/dL — ABNORMAL HIGH
EGFR (Non-African Amer.): 54 — ABNORMAL LOW
GLUCOSE: 105 mg/dL — AB
POTASSIUM: 3.4 mmol/L — AB
SGOT(AST): 17 U/L
SGPT (ALT): 16 U/L
SODIUM: 133 mmol/L — AB
Total Protein: 6.4 g/dL — ABNORMAL LOW

## 2014-07-21 LAB — CBC CANCER CENTER
BASOS ABS: 0.1 x10 3/mm (ref 0.0–0.1)
Basophil %: 0.7 %
EOS ABS: 0.1 x10 3/mm (ref 0.0–0.7)
Eosinophil %: 1.2 %
HCT: 42.9 % (ref 35.0–47.0)
HGB: 14.3 g/dL (ref 12.0–16.0)
LYMPHS ABS: 2.5 x10 3/mm (ref 1.0–3.6)
Lymphocyte %: 22.4 %
MCH: 30.9 pg (ref 26.0–34.0)
MCHC: 33.2 g/dL (ref 32.0–36.0)
MCV: 93 fL (ref 80–100)
MONO ABS: 0.7 x10 3/mm (ref 0.2–0.9)
Monocyte %: 6.3 %
NEUTROS ABS: 7.8 x10 3/mm — AB (ref 1.4–6.5)
Neutrophil %: 69.4 %
PLATELETS: 278 x10 3/mm (ref 150–440)
RBC: 4.62 10*6/uL (ref 3.80–5.20)
RDW: 13.8 % (ref 11.5–14.5)
WBC: 11.2 x10 3/mm — ABNORMAL HIGH (ref 3.6–11.0)

## 2014-07-21 LAB — MAGNESIUM: MAGNESIUM: 1.8 mg/dL

## 2014-07-22 LAB — CLOSTRIDIUM DIFFICILE(ARMC)

## 2014-07-28 LAB — COMPREHENSIVE METABOLIC PANEL
AST: 25 U/L
Albumin: 3.9 g/dL
Alkaline Phosphatase: 68 U/L
Anion Gap: 8 (ref 7–16)
BUN: 16 mg/dL
Bilirubin,Total: 0.4 mg/dL
CALCIUM: 9.5 mg/dL
CHLORIDE: 102 mmol/L
Co2: 29 mmol/L
Creatinine: 0.96 mg/dL
EGFR (African American): >60
GFR CALC NON AF AMER: 57 — AB
GLUCOSE: 92 mg/dL
Potassium: 4 mmol/L
SGPT (ALT): 19 U/L
SODIUM: 139 mmol/L
Total Protein: 6.6 g/dL

## 2014-07-28 LAB — CBC CANCER CENTER
BASOS PCT: 0.1 %
Basophil #: 0 x10 3/mm (ref 0.0–0.1)
EOS ABS: 0 x10 3/mm (ref 0.0–0.7)
Eosinophil %: 0 %
HCT: 42.9 % (ref 35.0–47.0)
HGB: 14.3 g/dL (ref 12.0–16.0)
LYMPHS ABS: 0.6 x10 3/mm — AB (ref 1.0–3.6)
LYMPHS PCT: 5.6 %
MCH: 30.6 pg (ref 26.0–34.0)
MCHC: 33.2 g/dL (ref 32.0–36.0)
MCV: 92 fL (ref 80–100)
MONO ABS: 0.1 x10 3/mm — AB (ref 0.2–0.9)
Monocyte %: 1.1 %
NEUTROS ABS: 10.7 x10 3/mm — AB (ref 1.4–6.5)
Neutrophil %: 93.2 %
Platelet: 287 x10 3/mm (ref 150–440)
RBC: 4.66 10*6/uL (ref 3.80–5.20)
RDW: 14.4 % (ref 11.5–14.5)
WBC: 11.5 x10 3/mm — ABNORMAL HIGH (ref 3.6–11.0)

## 2014-07-28 LAB — MAGNESIUM: Magnesium: 1.9 mg/dL

## 2014-07-31 NOTE — Op Note (Signed)
PATIENT NAME:  Jill Richards, Jill Richards MR#:  [id number] DATE OF BIRTH:  [birth year]  DATE OF PROCEDURE:  11/23/2013   PREOPERATIVE DIAGNOSIS: Lung cancer with poor venous access.   POSTOPERATIVE DIAGNOSIS: Lung cancer with poor venous access.   PROCEDURES:  1.  Ultrasound guidance for vascular access, right internal jugular vein.  2.  Fluoroscopic guidance for placement of catheter.  3.  Placement of CT compatible Port-A-Cath, right internal jugular vein.   SURGEON:  Algernon Huxley, MD  ANESTHESIA:  Local with moderate conscious sedation.   FLUOROSCOPY TIME:  Less than 1 minute.   CONTRAST:  Zero.   ESTIMATED BLOOD LOSS:  Minimal.   INDICATION FOR PROCEDURE:  A 79 year old white female with lung cancer, who needs a Port-A-Cath for chemotherapy. She begins her treatments later this week. Risks and benefits were discussed. Informed consent was obtained.   DESCRIPTION OF THE PROCEDURE:  The patient was brought to the vascular and interventional radiology suite. The right neck and chest were sterilely prepped and draped, and a sterile surgical field was created. Ultrasound was used to help visualize a patent right internal jugular vein. This was then accessed under direct ultrasound guidance without difficulty with a Seldinger needle and a permanent image was recorded. A J-wire was placed. After skin nick and dilatation, the peel-away sheath was then placed over the wire. I then anesthetized an area under the clavicle approximately 2 fingerbreadths. A transverse incision was created and an inferior pocket was created with electrocautery and blunt dissection. The port was then brought onto the field, placed into the pocket and secured to the chest wall with 2 Prolene sutures. The catheter was connected to the port and tunneled from the subclavicular incision to the access site. Fluoroscopic guidance was used to cut the catheter to an appropriate length. The catheter was then placed through the  peel-away sheath and the peel-away sheath was removed. The catheter tip was parked in excellent location in the distal superior vena cava just above the right atrium.  The pocket was then irrigated with antibiotic-impregnated saline and the wound was closed with a running 3-0 Vicryl and a 4-0 Monocryl. The access incision was closed with a single 4-0 Monocryl. The Huber needle was used to withdraw blood and flush the port with heparinized saline. Dermabond was then placed as a dressing. The patient tolerated the procedure well and was taken to the recovery room in stable condition.      ____________________________ Algernon Huxley, MD jsd:DT D: 11/23/2013 12:13:11 ET T: 11/23/2013 12:41:34 ET JOB#: [id number]  cc: Algernon Huxley, MD, <Dictator> Algernon Huxley MD ELECTRONICALLY SIGNED 11/23/2013 14:42

## 2014-08-13 ENCOUNTER — Other Ambulatory Visit: Payer: Self-pay | Admitting: *Deleted

## 2014-08-13 DIAGNOSIS — C3490 Malignant neoplasm of unspecified part of unspecified bronchus or lung: Secondary | ICD-10-CM

## 2014-08-18 ENCOUNTER — Encounter: Payer: Self-pay | Admitting: Oncology

## 2014-08-18 ENCOUNTER — Inpatient Hospital Stay (HOSPITAL_BASED_OUTPATIENT_CLINIC_OR_DEPARTMENT_OTHER): Payer: Medicare Other | Admitting: Oncology

## 2014-08-18 ENCOUNTER — Encounter (INDEPENDENT_AMBULATORY_CARE_PROVIDER_SITE_OTHER): Payer: Self-pay

## 2014-08-18 ENCOUNTER — Inpatient Hospital Stay: Payer: Medicare Other | Attending: Oncology

## 2014-08-18 ENCOUNTER — Inpatient Hospital Stay: Payer: Medicare Other

## 2014-08-18 VITALS — BP 149/80 | HR 91 | Temp 95.4°F | Wt 159.6 lb

## 2014-08-18 VITALS — BP 113/72 | HR 80 | Temp 96.9°F | Resp 20

## 2014-08-18 DIAGNOSIS — C771 Secondary and unspecified malignant neoplasm of intrathoracic lymph nodes: Secondary | ICD-10-CM | POA: Insufficient documentation

## 2014-08-18 DIAGNOSIS — C3411 Malignant neoplasm of upper lobe, right bronchus or lung: Secondary | ICD-10-CM | POA: Insufficient documentation

## 2014-08-18 DIAGNOSIS — Z7952 Long term (current) use of systemic steroids: Secondary | ICD-10-CM | POA: Insufficient documentation

## 2014-08-18 DIAGNOSIS — Z5111 Encounter for antineoplastic chemotherapy: Secondary | ICD-10-CM | POA: Diagnosis not present

## 2014-08-18 DIAGNOSIS — R197 Diarrhea, unspecified: Secondary | ICD-10-CM | POA: Diagnosis not present

## 2014-08-18 DIAGNOSIS — Z79899 Other long term (current) drug therapy: Secondary | ICD-10-CM | POA: Diagnosis not present

## 2014-08-18 DIAGNOSIS — F17210 Nicotine dependence, cigarettes, uncomplicated: Secondary | ICD-10-CM

## 2014-08-18 DIAGNOSIS — E039 Hypothyroidism, unspecified: Secondary | ICD-10-CM | POA: Insufficient documentation

## 2014-08-18 DIAGNOSIS — E785 Hyperlipidemia, unspecified: Secondary | ICD-10-CM | POA: Insufficient documentation

## 2014-08-18 DIAGNOSIS — C3430 Malignant neoplasm of lower lobe, unspecified bronchus or lung: Secondary | ICD-10-CM

## 2014-08-18 DIAGNOSIS — J449 Chronic obstructive pulmonary disease, unspecified: Secondary | ICD-10-CM | POA: Insufficient documentation

## 2014-08-18 DIAGNOSIS — C787 Secondary malignant neoplasm of liver and intrahepatic bile duct: Secondary | ICD-10-CM

## 2014-08-18 DIAGNOSIS — C3490 Malignant neoplasm of unspecified part of unspecified bronchus or lung: Secondary | ICD-10-CM

## 2014-08-18 DIAGNOSIS — Z800 Family history of malignant neoplasm of digestive organs: Secondary | ICD-10-CM | POA: Insufficient documentation

## 2014-08-18 DIAGNOSIS — C3412 Malignant neoplasm of upper lobe, left bronchus or lung: Secondary | ICD-10-CM

## 2014-08-18 DIAGNOSIS — M1990 Unspecified osteoarthritis, unspecified site: Secondary | ICD-10-CM | POA: Insufficient documentation

## 2014-08-18 DIAGNOSIS — I10 Essential (primary) hypertension: Secondary | ICD-10-CM | POA: Insufficient documentation

## 2014-08-18 DIAGNOSIS — Z09 Encounter for follow-up examination after completed treatment for conditions other than malignant neoplasm: Secondary | ICD-10-CM

## 2014-08-18 LAB — COMPREHENSIVE METABOLIC PANEL
ALT: 21 U/L (ref 14–54)
ANION GAP: 4 — AB (ref 5–15)
AST: 30 U/L (ref 15–41)
Albumin: 3.8 g/dL (ref 3.5–5.0)
Alkaline Phosphatase: 63 U/L (ref 38–126)
BILIRUBIN TOTAL: 0.5 mg/dL (ref 0.3–1.2)
BUN: 7 mg/dL (ref 6–20)
CALCIUM: 8.7 mg/dL — AB (ref 8.9–10.3)
CHLORIDE: 101 mmol/L (ref 101–111)
CO2: 29 mmol/L (ref 22–32)
CREATININE: 0.72 mg/dL (ref 0.44–1.00)
GFR calc Af Amer: >60 mL/min (ref >60)
Glucose, Bld: 94 mg/dL (ref 65–99)
Potassium: 3.5 mmol/L (ref 3.5–5.1)
SODIUM: 134 mmol/L — AB (ref 135–145)
Total Protein: 6.7 g/dL (ref 6.5–8.1)

## 2014-08-18 LAB — CBC WITH DIFFERENTIAL/PLATELET
Basophils Absolute: 0 10*3/uL (ref 0–0.1)
Basophils Relative: 1 %
EOS ABS: 0.1 10*3/uL (ref 0–0.7)
Eosinophils Relative: 2 %
HCT: 41 % (ref 35.0–47.0)
Hemoglobin: 13.8 g/dL (ref 12.0–16.0)
LYMPHS ABS: 1.3 10*3/uL (ref 1.0–3.6)
LYMPHS PCT: 18 %
MCH: 30.6 pg (ref 26.0–34.0)
MCHC: 33.5 g/dL (ref 32.0–36.0)
MCV: 91.2 fL (ref 80.0–100.0)
Monocytes Absolute: 0.6 10*3/uL (ref 0.2–0.9)
Monocytes Relative: 8 %
NEUTROS PCT: 71 %
Neutro Abs: 5.2 10*3/uL (ref 1.4–6.5)
Platelets: 241 10*3/uL (ref 150–440)
RBC: 4.5 MIL/uL (ref 3.80–5.20)
RDW: 14.6 % — ABNORMAL HIGH (ref 11.5–14.5)
WBC: 7.3 10*3/uL (ref 3.6–11.0)

## 2014-08-18 LAB — MAGNESIUM: Magnesium: 1.9 mg/dL (ref 1.7–2.4)

## 2014-08-18 MED ORDER — SODIUM CHLORIDE 0.9 % IJ SOLN
10.0000 mL | INTRAMUSCULAR | Status: DC | PRN
  Administered 2014-08-18: 10 mL via INTRAVENOUS
  Filled 2014-08-18: qty 10

## 2014-08-18 MED ORDER — HEPARIN SOD (PORK) LOCK FLUSH 100 UNIT/ML IV SOLN
500.0000 [IU] | Freq: Once | INTRAVENOUS | Status: AC | PRN
  Administered 2014-08-18: 500 [IU]
  Filled 2014-08-18: qty 5

## 2014-08-18 MED ORDER — SODIUM CHLORIDE 0.9 % IV SOLN
200.0000 mg | Freq: Once | INTRAVENOUS | Status: AC
  Administered 2014-08-18: 200 mg via INTRAVENOUS
  Filled 2014-08-18: qty 20

## 2014-08-18 NOTE — Progress Notes (Signed)
Jill Richards @ [facility] Telephone:(336) [phone]  Fax:(336) [place]: 1935/11/19  MR#: [id number]  YWV#:[id number]  Patient Care Team: Tama High III as PCP - General (Internal Medicine)  CHIEF COMPLAINT:  Chief Complaint  Patient presents with  . Follow-up    Oncology History   Non-small cell carcinoma of lung T1 N2 M1stage IV diseasePET scan is consistent with one small lesion in the T11 as well as suspected lesion in liver (diagnoses was in July of 2015) right upper lobe tumor with metastases to the left liver and mediastinal lymph node  MRI of liver conforms metastases to the liver.  MRI of thoracic spine raises possibility of metastases to the spine however this is inconclusive study.  (August, 2015) 2.  Patient was started on carboplatinum and Taxol started on November 25, 2013. 6 cycle of chemotherapy with carboplatinum and Taxol.  Last treatment was March 16, 2014 7.because of progressing disease on CT scan patient has been started on NIVO   in  February of 2016     Cancer, metastatic to liver   03/24/2014 Initial Diagnosis Cancer, metastatic to liver    Cancer of lung, upper lobe   03/24/2014 Initial Diagnosis Cancer of lung, upper lobe    No flowsheet data found.  INTERVAL HISTORY: 79 year old lady with history of carcinoma of lung stage IV disease.  Recurrent and progressive disease on chemotherapy.  Patient has been on NIVOLULAMAB because of diarrhea was holding chemotherapy.  Diarrhea is improved now patient is off prednisone.  Did not take any Imodium.  No abdominal pain.  Has gained weight.  Appetite is improving.  REVIEW OF SYSTEMS:   GENERAL:  Feels good.  Active.  No fevers, sweats or weight loss. PERFORMANCE STATUS (ECOG):  1 HEENT:  No visual changes, runny nose, sore throat, mouth sores or tenderness. Lungs: No shortness of breath or cough.  No hemoptysis. Cardiac:  No chest pain, palpitations, orthopnea, or PND. GI:  No  nausea, vomiting, diarrhea, constipation, melena or hematochezia. GU:  No urgency, frequency, dysuria, or hematuria. Musculoskeletal:  No back pain.  No joint pain.  No muscle tenderness. Extremities:  No pain or swelling. Skin:  No rashes or skin changes. Neuro:  No headache, numbness or weakness, balance or coordination issues. Endocrine:  No diabetes, thyroid issues, hot flashes or night sweats. Psych:  No mood changes, depression or anxiety. Pain:  No focal pain. Review of systems:  All other systems reviewed and found to be negative.  As per HPI. Otherwise, a complete review of systems is negatve.  PAST MEDICAL HISTORY: Past Medical History  Diagnosis Date  . Lung cancer non small cell    PAST SURGICAL HISTORY:  has been reviewed from the previous note  FAMILY HISTORY no family history of colon cancer or breast cancer Has been reviewed from the previous note       ADVANCED DIRECTIVES:  Patient does not have any living will.  Educational material is being given   HEALTH MAINTENANCE: History  Substance Use Topics  . Smoking status: Current Every Day Smoker -- 0.50 packs/day for 60 years  . Smokeless tobacco: Not on file  . Alcohol Use: Not on file      Allergies  Allergen Reactions  . Codeine Other (See Comments) and Nausea Only    GI Distress  . Hydrochlorothiazide Other (See Comments)    hypercalcemia  . Prednisone Swelling    Current Outpatient Prescriptions  Medication Sig  Dispense Refill  . albuterol (PROVENTIL HFA;VENTOLIN HFA) 108 (90 BASE) MCG/ACT inhaler Inhale 2 puffs into the lungs every 6 (six) hours as needed for wheezing or shortness of breath.    Marland Kitchen amLODipine (NORVASC) 10 MG tablet Take 10 mg by mouth daily.    . cetirizine (ZYRTEC) 10 MG tablet Take 10 mg by mouth daily.    . Cholecalciferol (VITAMIN D3) 2000 UNITS TABS Take 1 capsule by mouth daily.    . GuaiFENesin (MUCUS RELIEF ADULT PO) Take 1 tablet by mouth daily as needed.    Marland Kitchen  levothyroxine (SYNTHROID, LEVOTHROID) 125 MCG tablet Take 125 mcg by mouth daily before breakfast.    . Multiple Vitamin (MULTIVITAMIN WITH MINERALS) TABS tablet Take 1 tablet by mouth daily.    . Probiotic Product (PROBIOTIC FORMULA PO) Take 1 tablet by mouth daily.    . ondansetron (ZOFRAN) 4 MG tablet Take 4 mg by mouth every 6 (six) hours as needed for nausea or vomiting.    . potassium chloride SA (K-DUR,KLOR-CON) 20 MEQ tablet Take 20 mEq by mouth daily.    . predniSONE (DELTASONE) 50 MG tablet Take 50 mg by mouth daily with breakfast.     No current facility-administered medications for this visit.   Facility-Administered Medications Ordered in Other Visits  Medication Dose Route Frequency Provider Last Rate Last Dose  . sodium chloride 0.9 % injection 10 mL  10 mL Intravenous PRN Forest Gleason, MD   10 mL at 08/18/14 1000    OBJECTIVE:  Filed Vitals:   08/18/14 1045  BP: 149/80  Pulse: 91  Temp: 95.4 F (35.2 C)     Body mass index is 29.37 kg/(m^2).    ECOG FS:1 - Symptomatic but completely ambulatory  PHYSICAL EXAM: Goal status: Performance status is good.  Patient has not lost significant weight HEENT: No evidence of stomatitis.  And alopecia Sclera and conjunctivae :: No jaundice.   pale looking . Lungs: Air  entry equal on both sides.  No rhonchi.  No rales.  Cardiac: Heart sounds are normal.  No pericardial rub.  No murmur. Lymphatic system: Cervical, axillary, inguinal, lymph nodes not palpable GI: Abdomen is soft.  No ascites.  Liver spleen not palpable.  No tenderness.  Bowel sounds are within normal limit Lower extremity: No edema Neurological system: Higher functions, cranial nerves intact No evidence of peripheral neuropathy. Skin: No rash.  No ecchymosis..  Target lesion:   LAB RESULTS:  Infusion on 08/18/2014  Component Date Value Ref Range Status  . WBC 08/18/2014 7.3  3.6 - 11.0 K/uL Final  . RBC 08/18/2014 4.50  3.80 - 5.20 MIL/uL Final  .  Hemoglobin 08/18/2014 13.8  12.0 - 16.0 g/dL Final  . HCT 08/18/2014 41.0  35.0 - 47.0 % Final  . MCV 08/18/2014 91.2  80.0 - 100.0 fL Final  . MCH 08/18/2014 30.6  26.0 - 34.0 pg Final  . MCHC 08/18/2014 33.5  32.0 - 36.0 g/dL Final  . RDW 08/18/2014 14.6* 11.5 - 14.5 % Final  . Platelets 08/18/2014 241  150 - 440 K/uL Final  . Neutrophils Relative % 08/18/2014 71   Final  . Neutro Abs 08/18/2014 5.2  1.4 - 6.5 K/uL Final  . Lymphocytes Relative 08/18/2014 18   Final  . Lymphs Abs 08/18/2014 1.3  1.0 - 3.6 K/uL Final  . Monocytes Relative 08/18/2014 8   Final  . Monocytes Absolute 08/18/2014 0.6  0.2 - 0.9 K/uL Final  . Eosinophils Relative 08/18/2014  2   Final  . Eosinophils Absolute 08/18/2014 0.1  0 - 0.7 K/uL Final  . Basophils Relative 08/18/2014 1   Final  . Basophils Absolute 08/18/2014 0.0  0 - 0.1 K/uL Final  . Sodium 08/18/2014 134* 135 - 145 mmol/L Final  . Potassium 08/18/2014 3.5  3.5 - 5.1 mmol/L Final  . Chloride 08/18/2014 101  101 - 111 mmol/L Final  . CO2 08/18/2014 29  22 - 32 mmol/L Final  . Glucose, Bld 08/18/2014 94  65 - 99 mg/dL Final  . BUN 08/18/2014 7  6 - 20 mg/dL Final  . Creatinine, Ser 08/18/2014 0.72  0.44 - 1.00 mg/dL Final  . Calcium 08/18/2014 8.7* 8.9 - 10.3 mg/dL Final  . Total Protein 08/18/2014 6.7  6.5 - 8.1 g/dL Final  . Albumin 08/18/2014 3.8  3.5 - 5.0 g/dL Final  . AST 08/18/2014 30  15 - 41 U/L Final  . ALT 08/18/2014 21  14 - 54 U/L Final  . Alkaline Phosphatase 08/18/2014 63  38 - 126 U/L Final  . Total Bilirubin 08/18/2014 0.5  0.3 - 1.2 mg/dL Final  . GFR calc non Af Amer 08/18/2014 >60  >60 mL/min Final  . GFR calc Af Amer 08/18/2014 >60  >60 mL/min Final   Comment: (NOTE) The eGFR has been calculated using the CKD EPI equation. This calculation has not been validated in all clinical situations. eGFR's persistently <60 mL/min signify possible Chronic Kidney Disease.   . Anion gap 08/18/2014 4* 5 - 15 Final  . Magnesium  08/18/2014 1.9  1.7 - 2.4 mg/dL Final    No results found for: LABCA2  No results found for: CA199  No results found for: CA125  No results found for: CEA  No results found for: PSA  Infusion on 08/18/2014  Component Date Value Ref Range Status  . WBC 08/18/2014 7.3  3.6 - 11.0 K/uL Final  . RBC 08/18/2014 4.50  3.80 - 5.20 MIL/uL Final  . Hemoglobin 08/18/2014 13.8  12.0 - 16.0 g/dL Final  . HCT 08/18/2014 41.0  35.0 - 47.0 % Final  . MCV 08/18/2014 91.2  80.0 - 100.0 fL Final  . MCH 08/18/2014 30.6  26.0 - 34.0 pg Final  . MCHC 08/18/2014 33.5  32.0 - 36.0 g/dL Final  . RDW 08/18/2014 14.6* 11.5 - 14.5 % Final  . Platelets 08/18/2014 241  150 - 440 K/uL Final  . Neutrophils Relative % 08/18/2014 71   Final  . Neutro Abs 08/18/2014 5.2  1.4 - 6.5 K/uL Final  . Lymphocytes Relative 08/18/2014 18   Final  . Lymphs Abs 08/18/2014 1.3  1.0 - 3.6 K/uL Final  . Monocytes Relative 08/18/2014 8   Final  . Monocytes Absolute 08/18/2014 0.6  0.2 - 0.9 K/uL Final  . Eosinophils Relative 08/18/2014 2   Final  . Eosinophils Absolute 08/18/2014 0.1  0 - 0.7 K/uL Final  . Basophils Relative 08/18/2014 1   Final  . Basophils Absolute 08/18/2014 0.0  0 - 0.1 K/uL Final  . Sodium 08/18/2014 134* 135 - 145 mmol/L Final  . Potassium 08/18/2014 3.5  3.5 - 5.1 mmol/L Final  . Chloride 08/18/2014 101  101 - 111 mmol/L Final  . CO2 08/18/2014 29  22 - 32 mmol/L Final  . Glucose, Bld 08/18/2014 94  65 - 99 mg/dL Final  . BUN 08/18/2014 7  6 - 20 mg/dL Final  . Creatinine, Ser 08/18/2014 0.72  0.44 - 1.00 mg/dL Final  .  Calcium 08/18/2014 8.7* 8.9 - 10.3 mg/dL Final  . Total Protein 08/18/2014 6.7  6.5 - 8.1 g/dL Final  . Albumin 08/18/2014 3.8  3.5 - 5.0 g/dL Final  . AST 08/18/2014 30  15 - 41 U/L Final  . ALT 08/18/2014 21  14 - 54 U/L Final  . Alkaline Phosphatase 08/18/2014 63  38 - 126 U/L Final  . Total Bilirubin 08/18/2014 0.5  0.3 - 1.2 mg/dL Final  . GFR calc non Af Amer 08/18/2014  >60  >60 mL/min Final  . GFR calc Af Amer 08/18/2014 >60  >60 mL/min Final   Comment: (NOTE) The eGFR has been calculated using the CKD EPI equation. This calculation has not been validated in all clinical situations. eGFR's persistently <60 mL/min signify possible Chronic Kidney Disease.   . Anion gap 08/18/2014 4* 5 - 15 Final  . Magnesium 08/18/2014 1.9  1.7 - 2.4 mg/dL Final    (this displays the last labs from the last 3 days)    Lab Results  Component Value Date   RBC 4.50 08/18/2014     ASSESSMENT: Stage IV non-small cell carcinoma of lung in a patient continues to smoke. Diarrhea has improved patient is off prednisone for a week did not take any Imodium.  MEDICAL DECISION MAKING:  All lab data has been reviewed. Will initiate chemotherapy today and in 2 weeks and patient was instructed to call me if develops any diarrhea.  In that case change in the treatment may be necessary  Consider repeating PET scan for restaging in next few months  Patient expressed understanding and was in agreement with this plan. She also understands that She can call clinic at any time with any questions, concerns, or complaints.    Cancer of lung, upper lobe   Staging form: Lung, AJCC 7th Edition     Clinical: Stage IV (T1, N2, M1b) - Jill Griffon, MD   08/18/2014 11:09 AM

## 2014-08-19 ENCOUNTER — Telehealth: Payer: Self-pay | Admitting: *Deleted

## 2014-08-19 DIAGNOSIS — R197 Diarrhea, unspecified: Secondary | ICD-10-CM

## 2014-08-19 MED ORDER — PREDNISONE 5 MG PO TABS: ORAL_TABLET | ORAL | Status: DC

## 2014-08-19 NOTE — Telephone Encounter (Signed)
Per Dr Oliva Bustard, pt informed to take Imodium AD today and if does not control diarrhea, prednisone ta;er has been sent ot pharmacy and she is to start taking it. If diarrhea continues, she is to come in next week to be seen. She will see md on her next chemo appt . Pt verbalizes understanding adn said she will have her daughter go ahead and pickup the rx to have on hand.

## 2014-08-19 NOTE — Telephone Encounter (Signed)
Diarrhea is back since chemo yesterday

## 2014-08-27 ENCOUNTER — Other Ambulatory Visit: Payer: Self-pay | Admitting: *Deleted

## 2014-08-27 DIAGNOSIS — C3412 Malignant neoplasm of upper lobe, left bronchus or lung: Secondary | ICD-10-CM

## 2014-09-01 ENCOUNTER — Other Ambulatory Visit: Payer: Medicare Other

## 2014-09-01 ENCOUNTER — Inpatient Hospital Stay: Payer: Medicare Other

## 2014-09-01 ENCOUNTER — Inpatient Hospital Stay (HOSPITAL_BASED_OUTPATIENT_CLINIC_OR_DEPARTMENT_OTHER): Payer: Medicare Other | Admitting: Oncology

## 2014-09-01 ENCOUNTER — Ambulatory Visit: Payer: Medicare Other

## 2014-09-01 VITALS — BP 132/82 | HR 88 | Temp 95.7°F | Wt 160.1 lb

## 2014-09-01 DIAGNOSIS — Z7952 Long term (current) use of systemic steroids: Secondary | ICD-10-CM

## 2014-09-01 DIAGNOSIS — F17210 Nicotine dependence, cigarettes, uncomplicated: Secondary | ICD-10-CM

## 2014-09-01 DIAGNOSIS — C787 Secondary malignant neoplasm of liver and intrahepatic bile duct: Secondary | ICD-10-CM

## 2014-09-01 DIAGNOSIS — C771 Secondary and unspecified malignant neoplasm of intrathoracic lymph nodes: Secondary | ICD-10-CM | POA: Diagnosis not present

## 2014-09-01 DIAGNOSIS — C3411 Malignant neoplasm of upper lobe, right bronchus or lung: Secondary | ICD-10-CM | POA: Diagnosis not present

## 2014-09-01 DIAGNOSIS — R197 Diarrhea, unspecified: Secondary | ICD-10-CM

## 2014-09-01 DIAGNOSIS — Z9221 Personal history of antineoplastic chemotherapy: Secondary | ICD-10-CM

## 2014-09-01 DIAGNOSIS — Z79899 Other long term (current) drug therapy: Secondary | ICD-10-CM

## 2014-09-01 DIAGNOSIS — Z5111 Encounter for antineoplastic chemotherapy: Secondary | ICD-10-CM | POA: Diagnosis not present

## 2014-09-01 LAB — COMPREHENSIVE METABOLIC PANEL
ALBUMIN: 4.2 g/dL (ref 3.5–5.0)
ALT: 20 U/L (ref 14–54)
ANION GAP: 6 (ref 5–15)
AST: 23 U/L (ref 15–41)
Alkaline Phosphatase: 63 U/L (ref 38–126)
BUN: 14 mg/dL (ref 6–20)
CO2: 29 mmol/L (ref 22–32)
Calcium: 9.1 mg/dL (ref 8.9–10.3)
Chloride: 100 mmol/L — ABNORMAL LOW (ref 101–111)
Creatinine, Ser: 1.05 mg/dL — ABNORMAL HIGH (ref 0.44–1.00)
GFR calc Af Amer: 58 mL/min — ABNORMAL LOW (ref >60)
GFR calc non Af Amer: 50 mL/min — ABNORMAL LOW (ref >60)
Glucose, Bld: 100 mg/dL — ABNORMAL HIGH (ref 65–99)
Potassium: 4.3 mmol/L (ref 3.5–5.1)
Sodium: 135 mmol/L (ref 135–145)
Total Bilirubin: 0.5 mg/dL (ref 0.3–1.2)
Total Protein: 7.1 g/dL (ref 6.5–8.1)

## 2014-09-01 LAB — CBC WITH DIFFERENTIAL/PLATELET
Basophils Absolute: 0 10*3/uL (ref 0–0.1)
Basophils Relative: 0 %
Eosinophils Absolute: 0 10*3/uL (ref 0–0.7)
Eosinophils Relative: 0 %
HCT: 46.5 % (ref 35.0–47.0)
Hemoglobin: 15.2 g/dL (ref 12.0–16.0)
LYMPHS ABS: 1.8 10*3/uL (ref 1.0–3.6)
Lymphocytes Relative: 13 %
MCH: 30.1 pg (ref 26.0–34.0)
MCHC: 32.7 g/dL (ref 32.0–36.0)
MCV: 92 fL (ref 80.0–100.0)
MONOS PCT: 4 %
Monocytes Absolute: 0.5 10*3/uL (ref 0.2–0.9)
Neutro Abs: 11.7 10*3/uL — ABNORMAL HIGH (ref 1.4–6.5)
Neutrophils Relative %: 83 %
Platelets: 371 10*3/uL (ref 150–440)
RBC: 5.05 MIL/uL (ref 3.80–5.20)
RDW: 15.4 % — AB (ref 11.5–14.5)
WBC: 14.1 10*3/uL — AB (ref 3.6–11.0)

## 2014-09-01 LAB — MAGNESIUM: Magnesium: 2 mg/dL (ref 1.7–2.4)

## 2014-09-01 MED ORDER — PREDNISONE 5 MG PO TABS: ORAL_TABLET | ORAL | Status: DC

## 2014-09-01 NOTE — Progress Notes (Signed)
Pt currently smokes.  Does not have living will.  Pt c/o sensitivity on right leg (calf arrea), dry patchy skin.

## 2014-09-06 ENCOUNTER — Encounter: Payer: Self-pay | Admitting: Oncology

## 2014-09-06 NOTE — Progress Notes (Signed)
[place] @ [facility] Telephone:(336) [phone]  Fax:(336) [place]: 1935-06-20  MR#: [id number]  DJM#:[id number]  Patient Care Team: Adin Hector, MD as PCP - General (Internal Medicine)  CHIEF COMPLAINT:  Chief Complaint  Patient presents with  . Follow-up    Oncology History   Non-small cell carcinoma of lung T1 N2 M1stage IV diseasePET scan is consistent with one small lesion in the T11 as well as suspected lesion in liver (diagnoses was in July of 2015) right upper lobe tumor with metastases to the left liver and mediastinal lymph node  MRI of liver conforms metastases to the liver.  MRI of thoracic spine raises possibility of metastases to the spine however this is inconclusive study.  (August, 2015) 2.  Patient was started on carboplatinum and Taxol started on November 25, 2013. 6 cycle of chemotherapy with carboplatinum and Taxol.  Last treatment was March 16, 2014 7.because of progressing disease on CT scan patient has been started on NIVO   in  February of 2016     Cancer, metastatic to liver   03/24/2014 Initial Diagnosis Cancer, metastatic to liver    Cancer of lung, upper lobe   03/24/2014 Initial Diagnosis Cancer of lung, upper lobe    Oncology Flowsheet 08/18/2014  nivolumab (OPDIVO) IV 200 mg    INTERVAL HISTORY:  79 year old lady who developed diarrhea secondary to   [place]. Marland Kitchen Patient was off prednisone and had again loose stool 2 were 3 per day.  Here for further follow-up no chills.  No fever.  No nausea.  No vomiting. REVIEW OF SYSTEMS:   GENERAL:  Feels good.  Active.  No fevers, sweats or weight loss. PERFORMANCE STATUS (ECOG):  01 HEENT:  No visual changes, runny nose, sore throat, mouth sores or tenderness. Lungs: No shortness of breath or cough.  No hemoptysis. Cardiac:  No chest pain, palpitations, orthopnea, or PND. GI:  After prednisone was discontinued patient started getting diarrhea again. Abdominal  discomfort.  No blood or mucus in the stool.  Watery stool 2 or 3 a day.  No chills.  No fever. GU:  No urgency, frequency, dysuria, or hematuria. Musculoskeletal:  No back pain.  No joint pain.  No muscle tenderness. Extremities:  No pain or swelling. Skin:  No rashes or skin changes. Neuro:  No headache, numbness or weakness, balance or coordination issues. Endocrine:  No diabetes, thyroid issues, hot flashes or night sweats. Psych:  No mood changes, depression or anxiety. Pain:  No focal pain. Review of systems:  All other systems reviewed and found to be negative.  As per HPI. Otherwise, a complete review of systems is negatve.  PAST MEDICAL HISTORY: Past Medical History  Diagnosis Date  . Lung cancer non small cell    PAST SURGICAL HISTORY: No past surgical history on file.  FAMILY HISTORY  Smoking History 0.5(1)60 years(1)  PFSH: Comments: family history of colon cancer cancer of uterus and history of hypertension and heart disease  Social History: positive tobacco  Smoker: Smoking cessation counseling performed  Smoker- Packs Per Day: 0.5  Smoker- How Many Years: 50-  Additional Past Medical and Surgical History: As mentioned above         ADVANCED DIRECTIVES: Patient does not have any advanced healthcare directive. Information has been given.   HEALTH MAINTENANCE: History  Substance Use Topics  . Smoking status: Current Every Day Smoker -- 0.50 packs/day for 60 years  . Smokeless tobacco: Not  on file  . Alcohol Use: Not on file       Allergies  Allergen Reactions  . Codeine Other (See Comments) and Nausea Only    GI Distress  . Hydrochlorothiazide Other (See Comments)    hypercalcemia  . Prednisone Swelling    Current Outpatient Prescriptions  Medication Sig Dispense Refill  . albuterol (PROVENTIL HFA;VENTOLIN HFA) 108 (90 BASE) MCG/ACT inhaler Inhale 2 puffs into the lungs every 6 (six) hours as needed for wheezing or shortness of breath.    Marland Kitchen  amLODipine (NORVASC) 10 MG tablet Take 10 mg by mouth daily.    . cetirizine (ZYRTEC) 10 MG tablet Take 10 mg by mouth daily.    . Cholecalciferol (VITAMIN D3) 2000 UNITS TABS Take 1 capsule by mouth daily.    . GuaiFENesin (MUCUS RELIEF ADULT PO) Take 1 tablet by mouth daily as needed.    Marland Kitchen levothyroxine (SYNTHROID, LEVOTHROID) 125 MCG tablet Take 125 mcg by mouth daily before breakfast.    . Multiple Vitamin (MULTIVITAMIN WITH MINERALS) TABS tablet Take 1 tablet by mouth daily.    . Probiotic Product (PROBIOTIC FORMULA PO) Take 1 tablet by mouth daily.    . ondansetron (ZOFRAN) 4 MG tablet Take 4 mg by mouth every 6 (six) hours as needed for nausea or vomiting.    . potassium chloride SA (K-DUR,KLOR-CON) 20 MEQ tablet Take 20 mEq by mouth daily.    . predniSONE (DELTASONE) 5 MG tablet 10 tabs po x 1 day 9.5 tabs po x 1 day 9 tabs po x 1 day 8.5 tabs po x 1 day 8 tabs po x 1 day 7.5 tabs po x 1 day 7 tabs po x 1 day 6.5 tabs po x 1 day 6 tabs po x 1 day 5.5 tabs po x 1 day 5 tabs po x 1 day 4.5 tabs po x 1 day 4 tabs po x 1 day 3.5 tabs po x 1 day 3 tabs po x 1 day 2.5 tabs po x 1 day 2 tabs po x 1 day 1.5 tabs po x 1 day 1 tab po x 1 day 0.5 tabs po x 1 day 105 tablet 0   No current facility-administered medications for this visit.    OBJECTIVE:  Filed Vitals:   09/01/14 1011  BP: 132/82  Pulse: 88  Temp: 95.7 F (35.4 C)     Body mass index is 29.45 kg/(m^2).    ECOG FS:1 - Symptomatic but completely ambulatory  PHYSICAL EXAM: Goal status: Performance status is good.  Patient has not lost significant weight HEENT: No evidence of stomatitis. Sclera and conjunctivae :: No jaundice.   pale looking. Lungs: Air  entry equal on both sides.  No rhonchi.  No rales.  Cardiac: Heart sounds are normal.  No pericardial rub.  No murmur. Lymphatic system: Cervical, axillary, inguinal, lymph nodes not palpable GI: Abdomen is soft.  No ascites.  Liver spleen not palpable.  No  tenderness.  Bowel sounds are within normal limit Lower extremity: No edema Neurological system: Higher functions, cranial nerves intact no evidence of peripheral neuropathy. Skin: No rash.  No ecchymosis.. NO clinical signs of dehydration LAB RESULTS:  Appointment on 09/01/2014  Component Date Value Ref Range Status  . Magnesium 09/01/2014 2.0  1.7 - 2.4 mg/dL Final  . WBC 09/01/2014 14.1* 3.6 - 11.0 K/uL Final  . RBC 09/01/2014 5.05  3.80 - 5.20 MIL/uL Final  . Hemoglobin 09/01/2014 15.2  12.0 - 16.0 g/dL Final  .  HCT 09/01/2014 46.5  35.0 - 47.0 % Final  . MCV 09/01/2014 92.0  80.0 - 100.0 fL Final  . MCH 09/01/2014 30.1  26.0 - 34.0 pg Final  . MCHC 09/01/2014 32.7  32.0 - 36.0 g/dL Final  . RDW 09/01/2014 15.4* 11.5 - 14.5 % Final  . Platelets 09/01/2014 371  150 - 440 K/uL Final  . Neutrophils Relative % 09/01/2014 83   Final  . Neutro Abs 09/01/2014 11.7* 1.4 - 6.5 K/uL Final  . Lymphocytes Relative 09/01/2014 13   Final  . Lymphs Abs 09/01/2014 1.8  1.0 - 3.6 K/uL Final  . Monocytes Relative 09/01/2014 4   Final  . Monocytes Absolute 09/01/2014 0.5  0.2 - 0.9 K/uL Final  . Eosinophils Relative 09/01/2014 0   Final  . Eosinophils Absolute 09/01/2014 0.0  0 - 0.7 K/uL Final  . Basophils Relative 09/01/2014 0   Final  . Basophils Absolute 09/01/2014 0.0  0 - 0.1 K/uL Final  . Sodium 09/01/2014 135  135 - 145 mmol/L Final  . Potassium 09/01/2014 4.3  3.5 - 5.1 mmol/L Final  . Chloride 09/01/2014 100* 101 - 111 mmol/L Final  . CO2 09/01/2014 29  22 - 32 mmol/L Final  . Glucose, Bld 09/01/2014 100* 65 - 99 mg/dL Final  . BUN 09/01/2014 14  6 - 20 mg/dL Final  . Creatinine, Ser 09/01/2014 1.05* 0.44 - 1.00 mg/dL Final  . Calcium 09/01/2014 9.1  8.9 - 10.3 mg/dL Final  . Total Protein 09/01/2014 7.1  6.5 - 8.1 g/dL Final  . Albumin 09/01/2014 4.2  3.5 - 5.0 g/dL Final  . AST 09/01/2014 23  15 - 41 U/L Final  . ALT 09/01/2014 20  14 - 54 U/L Final  . Alkaline Phosphatase  09/01/2014 63  38 - 126 U/L Final  . Total Bilirubin 09/01/2014 0.5  0.3 - 1.2 mg/dL Final  . GFR calc non Af Amer 09/01/2014 50* >60 mL/min Final  . GFR calc Af Amer 09/01/2014 58* >60 mL/min Final   Comment: (NOTE) The eGFR has been calculated using the CKD EPI equation. This calculation has not been validated in all clinical situations. eGFR's persistently <60 mL/min signify possible Chronic Kidney Disease.   . Anion gap 09/01/2014 6  5 - 15 Final      STUDIES: No results found.  ASSESSMENT: Carcinoma of lung, non-small cell type.  Stage IV patient was on NIVOLULAMAB Diarrhea most likely secondary to [place]:  All lab data has been reviewed Considering diarrhea is gradually decreased with continue prednisone with a slow taper Because of cardiac, recurrent is afterNIVOLULAMAB  HAS BEEN re started at this point in time NIVOLULAMAB would be discontinued.. Incidental duration of chemotherapy with carboplatinum and gemcitabinecis-platinum and gemcitabine particularly if PET scan shows progressive disease. PET scan has been scheduled reevaluation after PET scan is done Prednisone  slow taper . if diarrhea continues to get in touch with me There are no clinical signs of dehydration  Patient expressed understanding and was in agreement with this plan. She also understands that She can call clinic at any time with any questions, concerns, or complaints.    Cancer of lung, upper lobe   Staging form: Lung, AJCC 7th Edition     Clinical: Stage IV (T1, N2, M1b) - Marni Griffon, MD   09/06/2014 10:43 AM

## 2014-09-08 ENCOUNTER — Telehealth: Payer: Self-pay | Admitting: *Deleted

## 2014-09-08 NOTE — Telephone Encounter (Signed)
Appt cancelled per VO Dr Oliva Bustard and pt informed.

## 2014-09-15 ENCOUNTER — Other Ambulatory Visit: Payer: Medicare Other

## 2014-09-15 ENCOUNTER — Ambulatory Visit: Payer: Medicare Other | Admitting: Family Medicine

## 2014-09-15 ENCOUNTER — Ambulatory Visit: Payer: Medicare Other

## 2014-09-29 ENCOUNTER — Ambulatory Visit
Admission: RE | Admit: 2014-09-29 | Discharge: 2014-09-29 | Disposition: A | Discharge status: Home / Self Care | Payer: Medicare Other | Source: Ambulatory Visit | Attending: Oncology | Admitting: Oncology

## 2014-09-29 DIAGNOSIS — I2510 Atherosclerotic heart disease of native coronary artery without angina pectoris: Secondary | ICD-10-CM | POA: Insufficient documentation

## 2014-09-29 DIAGNOSIS — C787 Secondary malignant neoplasm of liver and intrahepatic bile duct: Secondary | ICD-10-CM | POA: Insufficient documentation

## 2014-09-29 DIAGNOSIS — C801 Malignant (primary) neoplasm, unspecified: Secondary | ICD-10-CM | POA: Insufficient documentation

## 2014-09-29 DIAGNOSIS — K8020 Calculus of gallbladder without cholecystitis without obstruction: Secondary | ICD-10-CM | POA: Diagnosis not present

## 2014-09-29 LAB — GLUCOSE, CAPILLARY: GLUCOSE-CAPILLARY: 75 mg/dL (ref 65–99)

## 2014-09-29 MED ORDER — FLUDEOXYGLUCOSE F - 18 (FDG) INJECTION
12.1200 | Freq: Once | INTRAVENOUS | Status: AC | PRN
  Administered 2014-09-29: 12.12 via INTRAVENOUS

## 2014-10-04 ENCOUNTER — Inpatient Hospital Stay: Payer: Medicare Other

## 2014-10-04 ENCOUNTER — Telehealth: Payer: Self-pay | Admitting: *Deleted

## 2014-10-04 ENCOUNTER — Inpatient Hospital Stay (HOSPITAL_BASED_OUTPATIENT_CLINIC_OR_DEPARTMENT_OTHER): Payer: Medicare Other | Admitting: Oncology

## 2014-10-04 ENCOUNTER — Inpatient Hospital Stay: Payer: Medicare Other | Attending: Oncology

## 2014-10-04 VITALS — BP 134/80 | HR 106 | Temp 97.1°F | Wt 162.0 lb

## 2014-10-04 DIAGNOSIS — C3411 Malignant neoplasm of upper lobe, right bronchus or lung: Secondary | ICD-10-CM

## 2014-10-04 DIAGNOSIS — K8020 Calculus of gallbladder without cholecystitis without obstruction: Secondary | ICD-10-CM

## 2014-10-04 DIAGNOSIS — I2510 Atherosclerotic heart disease of native coronary artery without angina pectoris: Secondary | ICD-10-CM | POA: Diagnosis not present

## 2014-10-04 DIAGNOSIS — C771 Secondary and unspecified malignant neoplasm of intrathoracic lymph nodes: Secondary | ICD-10-CM | POA: Diagnosis not present

## 2014-10-04 DIAGNOSIS — Z79899 Other long term (current) drug therapy: Secondary | ICD-10-CM | POA: Diagnosis not present

## 2014-10-04 DIAGNOSIS — C787 Secondary malignant neoplasm of liver and intrahepatic bile duct: Secondary | ICD-10-CM | POA: Insufficient documentation

## 2014-10-04 DIAGNOSIS — Z87891 Personal history of nicotine dependence: Secondary | ICD-10-CM | POA: Insufficient documentation

## 2014-10-04 DIAGNOSIS — Z9221 Personal history of antineoplastic chemotherapy: Secondary | ICD-10-CM | POA: Insufficient documentation

## 2014-10-04 DIAGNOSIS — E876 Hypokalemia: Secondary | ICD-10-CM | POA: Diagnosis not present

## 2014-10-04 DIAGNOSIS — R197 Diarrhea, unspecified: Secondary | ICD-10-CM | POA: Diagnosis not present

## 2014-10-04 DIAGNOSIS — C801 Malignant (primary) neoplasm, unspecified: Secondary | ICD-10-CM

## 2014-10-04 DIAGNOSIS — C3412 Malignant neoplasm of upper lobe, left bronchus or lung: Secondary | ICD-10-CM

## 2014-10-04 LAB — CBC WITH DIFFERENTIAL/PLATELET
BASOS PCT: 1 %
Basophils Absolute: 0 10*3/uL (ref 0–0.1)
Eosinophils Absolute: 0.1 10*3/uL (ref 0–0.7)
Eosinophils Relative: 1 %
HCT: 40.5 % (ref 35.0–47.0)
HEMOGLOBIN: 13.6 g/dL (ref 12.0–16.0)
Lymphocytes Relative: 28 %
Lymphs Abs: 1.4 10*3/uL (ref 1.0–3.6)
MCH: 30.8 pg (ref 26.0–34.0)
MCHC: 33.5 g/dL (ref 32.0–36.0)
MCV: 92 fL (ref 80.0–100.0)
MONOS PCT: 17 %
Monocytes Absolute: 0.8 10*3/uL (ref 0.2–0.9)
NEUTROS ABS: 2.6 10*3/uL (ref 1.4–6.5)
Neutrophils Relative %: 53 %
Platelets: 325 10*3/uL (ref 150–440)
RBC: 4.41 MIL/uL (ref 3.80–5.20)
RDW: 15.2 % — ABNORMAL HIGH (ref 11.5–14.5)
WBC: 5 10*3/uL (ref 3.6–11.0)

## 2014-10-04 LAB — COMPREHENSIVE METABOLIC PANEL
ALT: 18 U/L (ref 14–54)
ANION GAP: 6 (ref 5–15)
AST: 25 U/L (ref 15–41)
Albumin: 3.5 g/dL (ref 3.5–5.0)
Alkaline Phosphatase: 55 U/L (ref 38–126)
BUN: 7 mg/dL (ref 6–20)
CHLORIDE: 101 mmol/L (ref 101–111)
CO2: 25 mmol/L (ref 22–32)
CREATININE: 0.83 mg/dL (ref 0.44–1.00)
Calcium: 8.5 mg/dL — ABNORMAL LOW (ref 8.9–10.3)
GFR calc non Af Amer: >60 mL/min (ref >60)
GLUCOSE: 106 mg/dL — AB (ref 65–99)
Potassium: 2.7 mmol/L — CL (ref 3.5–5.1)
Sodium: 132 mmol/L — ABNORMAL LOW (ref 135–145)
Total Bilirubin: 0.7 mg/dL (ref 0.3–1.2)
Total Protein: 6.3 g/dL — ABNORMAL LOW (ref 6.5–8.1)

## 2014-10-04 MED ORDER — SODIUM CHLORIDE 0.9 % IJ SOLN
10.0000 mL | INTRAMUSCULAR | Status: DC | PRN
  Administered 2014-10-04: 10 mL via INTRAVENOUS
  Filled 2014-10-04: qty 10

## 2014-10-04 MED ORDER — HEPARIN SOD (PORK) LOCK FLUSH 100 UNIT/ML IV SOLN
500.0000 [IU] | Freq: Once | INTRAVENOUS | Status: AC
  Administered 2014-10-04: 500 [IU] via INTRAVENOUS
  Filled 2014-10-04: qty 5

## 2014-10-04 MED ORDER — POTASSIUM CHLORIDE CRYS ER 20 MEQ PO TBCR: EXTENDED_RELEASE_TABLET | ORAL | Status: DC

## 2014-10-04 MED ORDER — PREDNISONE 5 MG PO TABS: ORAL_TABLET | ORAL | Status: DC

## 2014-10-04 NOTE — Telephone Encounter (Signed)
Lu from clinical lab called with critical value for K+ 2.7.  MD notified.

## 2014-10-04 NOTE — Progress Notes (Signed)
Does not have living will.  Currently smoking.

## 2014-10-08 ENCOUNTER — Encounter: Payer: Self-pay | Admitting: Oncology

## 2014-10-08 NOTE — Progress Notes (Signed)
[place] @ [facility] Telephone:(336) [phone]  Fax:(336) [place]: [birth year]  MR#: [id number]  QZR#:[id number]  Patient Care Team: Adin Hector, MD as PCP - General (Internal Medicine)  CHIEF COMPLAINT:  Chief Complaint  Patient presents with  . Follow-up    Oncology History   Non-small cell carcinoma of lung T1 N2 M1stage IV diseasePET scan is consistent with one small lesion in the T11 as well as suspected lesion in liver (diagnoses was in July of 2015) right upper lobe tumor with metastases to the left liver and mediastinal lymph node  MRI of liver conforms metastases to the liver.  MRI of thoracic spine raises possibility of metastases to the spine however this is inconclusive study.  (August, 2015) 2.  Patient was started on carboplatinum and Taxol started on November 25, 2013. 6 cycle of chemotherapy with carboplatinum and Taxol.  Last treatment was March 16, 2014 7.because of progressing disease on CT scan patient has been started on NIVO   in  February of 2016 8.  Patient is off NIVO because of diarrhea (June, 2016)     Cancer, metastatic to liver   03/24/2014 Initial Diagnosis Cancer, metastatic to liver    Cancer of lung, upper lobe   03/24/2014 Initial Diagnosis Cancer of lung, upper lobe    Oncology Flowsheet 08/18/2014  nivolumab (OPDIVO) IV 200 mg    INTERVAL HISTORY:  79 year old lady who developed diarrhea secondary to   [place]. Marland Kitchen Patient was off prednisone and had again loose stool 2 were 3 per day.  Here for further follow-up no chills.  No fever.  No nausea.  No vomiting.. June 27, 016 Patient is here for ongoing evaluation continues to have diarrhea off and on.  Restart one more course of prednisone therapy.  Patient had a previous history of ES.  Previously patient did not receive hYOSCAMINE .  So would be started again.  No nausea no vomiting not lost any weight patient is hypokalemic REVIEW OF SYSTEMS:   GENERAL:   Feels good.  Active.  No fevers, sweats or weight loss. PERFORMANCE STATUS (ECOG):  01 HEENT:  No visual changes, runny nose, sore throat, mouth sores or tenderness. Lungs: No shortness of breath or cough.  No hemoptysis. Cardiac:  No chest pain, palpitations, orthopnea, or PND. GI:  After prednisone was discontinued patient started getting diarrhea again. Abdominal discomfort.  No blood or mucus in the stool.  Watery stool 2 or 3 a day.  No chills.  No fever. GU:  No urgency, frequency, dysuria, or hematuria. Musculoskeletal:  No back pain.  No joint pain.  No muscle tenderness. Extremities:  No pain or swelling. Skin:  No rashes or skin changes. Neuro:  No headache, numbness or weakness, balance or coordination issues. Endocrine:  No diabetes, thyroid issues, hot flashes or night sweats. Psych:  No mood changes, depression or anxiety. Pain:  No focal pain. Review of systems:  All other systems reviewed and found to be negative.  As per HPI. Otherwise, a complete review of systems is negatve.  PAST MEDICAL HISTORY: Past Medical History  Diagnosis Date  . Lung cancer non small cell    PAST SURGICAL HISTORY: No past surgical history on file.  FAMILY HISTORY  Smoking History 0.5(1)60 years(1)  PFSH: Comments: family history of colon cancer cancer of uterus and history of hypertension and heart disease  Social History: positive tobacco  Smoker: Smoking cessation counseling performed  Smoker- Packs  Per Day: 0.5  Smoker- How Many Years: 77-  Additional Past Medical and Surgical History: As mentioned above         ADVANCED DIRECTIVES: Patient does not have any advanced healthcare directive. Information has been given.   HEALTH MAINTENANCE: History  Substance Use Topics  . Smoking status: Current Every Day Smoker -- 0.50 packs/day for 60 years  . Smokeless tobacco: Not on file  . Alcohol Use: Not on file       Allergies  Allergen Reactions  . Codeine Other (See  Comments) and Nausea Only    GI Distress  . Hydrochlorothiazide Other (See Comments)    hypercalcemia  . Prednisone Swelling    Current Outpatient Prescriptions  Medication Sig Dispense Refill  . albuterol (PROVENTIL HFA;VENTOLIN HFA) 108 (90 BASE) MCG/ACT inhaler Inhale 2 puffs into the lungs every 6 (six) hours as needed for wheezing or shortness of breath.    Marland Kitchen amLODipine (NORVASC) 10 MG tablet Take 10 mg by mouth daily.    . benzonatate (TESSALON) 200 MG capsule Take by mouth.    . cetirizine (ZYRTEC) 10 MG tablet Take 10 mg by mouth daily.    . Cholecalciferol (VITAMIN D3) 2000 UNITS TABS Take 1 capsule by mouth daily.    . GuaiFENesin (MUCUS RELIEF ADULT PO) Take 1 tablet by mouth daily as needed.    . hyoscyamine (LEVBID) 0.375 MG 12 hr tablet Take 0.375 mg by mouth 2 (two) times daily.    Marland Kitchen levothyroxine (SYNTHROID, LEVOTHROID) 125 MCG tablet Take 125 mcg by mouth daily before breakfast.    . Multiple Vitamin (MULTIVITAMIN WITH MINERALS) TABS tablet Take 1 tablet by mouth daily.    . ondansetron (ZOFRAN) 4 MG tablet Take 4 mg by mouth every 6 (six) hours as needed for nausea or vomiting.    . Probiotic Product (PROBIOTIC FORMULA PO) Take 1 tablet by mouth daily.    . potassium chloride SA (K-DUR,KLOR-CON) 20 MEQ tablet Take 1 tablet PO twice a day for 15 days, then take 1 tablet PO daily. 45 tablet 3  . predniSONE (DELTASONE) 5 MG tablet Take 12 tabs po x 1 day, 11 tabs po x 1 day, 10 tabs po x 1 day, 9 tabs po x 1 day, 8 tabs po x 1 day, 7 tabs po x 1 day, 6 tabs po x 1 day, 5 tabs po x 1 day, 4 tabs po x 1 day, 3 tabs po x 1 day, 2 tabs po x 1 day, 1 tab po x 1 day 78 tablet 0   No current facility-administered medications for this visit.    OBJECTIVE:  Filed Vitals:   10/04/14 1053  BP: 134/80  Pulse: 106  Temp: 97.1 F (36.2 C)     Body mass index is 30.63 kg/(m^2).    ECOG FS:1 - Symptomatic but completely ambulatory  PHYSICAL EXAM: Goal status: Performance status is  good.  Patient has not lost significant weight HEENT: No evidence of stomatitis. Sclera and conjunctivae :: No jaundice.   pale looking. Lungs: Air  entry equal on both sides.  No rhonchi.  No rales.  Cardiac: Heart sounds are normal.  No pericardial rub.  No murmur. Lymphatic system: Cervical, axillary, inguinal, lymph nodes not palpable GI: Abdomen is soft.  No ascites.  Liver spleen not palpable.  No tenderness.  Bowel sounds are within normal limit Lower extremity: No edema Neurological system: Higher functions, cranial nerves intact no evidence of peripheral neuropathy. Skin: No rash.  No ecchymosis.. NO clinical signs of dehydration LAB RESULTS:  Appointment on 10/04/2014  Component Date Value Ref Range Status  . WBC 10/04/2014 5.0  3.6 - 11.0 K/uL Final  . RBC 10/04/2014 4.41  3.80 - 5.20 MIL/uL Final  . Hemoglobin 10/04/2014 13.6  12.0 - 16.0 g/dL Final  . HCT 10/04/2014 40.5  35.0 - 47.0 % Final  . MCV 10/04/2014 92.0  80.0 - 100.0 fL Final  . MCH 10/04/2014 30.8  26.0 - 34.0 pg Final  . MCHC 10/04/2014 33.5  32.0 - 36.0 g/dL Final  . RDW 10/04/2014 15.2* 11.5 - 14.5 % Final  . Platelets 10/04/2014 325  150 - 440 K/uL Final  . Neutrophils Relative % 10/04/2014 53   Final  . Neutro Abs 10/04/2014 2.6  1.4 - 6.5 K/uL Final  . Lymphocytes Relative 10/04/2014 28   Final  . Lymphs Abs 10/04/2014 1.4  1.0 - 3.6 K/uL Final  . Monocytes Relative 10/04/2014 17   Final  . Monocytes Absolute 10/04/2014 0.8  0.2 - 0.9 K/uL Final  . Eosinophils Relative 10/04/2014 1   Final  . Eosinophils Absolute 10/04/2014 0.1  0 - 0.7 K/uL Final  . Basophils Relative 10/04/2014 1   Final  . Basophils Absolute 10/04/2014 0.0  0 - 0.1 K/uL Final  . Sodium 10/04/2014 132* 135 - 145 mmol/L Final  . Potassium 10/04/2014 2.7* 3.5 - 5.1 mmol/L Final   Comment: RESULTS VERIFIED BY REPEAT TESTING CRITICAL RESULT CALLED TO, READ BACK BY AND VERIFIED WITH ANITA BLACK  1200 10/04/14 BY [facility]   . Chloride  10/04/2014 101  101 - 111 mmol/L Final  . CO2 10/04/2014 25  22 - 32 mmol/L Final  . Glucose, Bld 10/04/2014 106* 65 - 99 mg/dL Final  . BUN 10/04/2014 7  6 - 20 mg/dL Final  . Creatinine, Ser 10/04/2014 0.83  0.44 - 1.00 mg/dL Final  . Calcium 10/04/2014 8.5* 8.9 - 10.3 mg/dL Final  . Total Protein 10/04/2014 6.3* 6.5 - 8.1 g/dL Final  . Albumin 10/04/2014 3.5  3.5 - 5.0 g/dL Final  . AST 10/04/2014 25  15 - 41 U/L Final  . ALT 10/04/2014 18  14 - 54 U/L Final  . Alkaline Phosphatase 10/04/2014 55  38 - 126 U/L Final  . Total Bilirubin 10/04/2014 0.7  0.3 - 1.2 mg/dL Final  . GFR calc non Af Amer 10/04/2014 >60  >60 mL/min Final  . GFR calc Af Amer 10/04/2014 >60  >60 mL/min Final   Comment: (NOTE) The eGFR has been calculated using the CKD EPI equation. This calculation has not been validated in all clinical situations. eGFR's persistently <60 mL/min signify possible Chronic Kidney Disease.   . Anion gap 10/04/2014 6  5 - 15 Final      STUDIES: Nm Pet Image Restag (ps) Skull Base To Thigh  09/29/2014   CLINICAL DATA:  Subsequent treatment strategy for lung cancer metastatic to liver.  EXAM: NUCLEAR MEDICINE PET SKULL BASE TO THIGH  TECHNIQUE: 12.1 mCi F-18 FDG was injected intravenously. Full-ring PET imaging was performed from the skull base to thigh after the radiotracer. CT data was obtained and used for attenuation correction and anatomic localization.  FASTING BLOOD GLUCOSE:  Value: 75 mg/dl  COMPARISON:  04/06/2014, MR abdomen 11/26/2013 and CT chest 10/02/2013.  FINDINGS: NECK  No hypermetabolic lymph nodes in the neck. CT images show no acute findings.  CHEST  There is mild hypermetabolism within the thyroid, decreased from prior. High  right paratracheal lymph node measures 8 mm (CT image 68) with an SUV max of 6.7. There are hypermetabolic right hilar lymph nodes, not discretely seen on CT without IV contrast, with an SUV max of 6.0. A mass in the medial aspect of the anterior  segment right upper lobe measures 2.0 x 3.1 cm with an SUV max of 22.5. Additional scattered tiny pulmonary nodules are too small for PET resolution and were likely present on 04/06/2014. CT images show a right IJ Port-A-Cath terminating in the low SVC. Three-vessel coronary artery calcification. Heart is at the upper limit of normal in size to mildly enlarged. No pericardial or pleural effusion. Scarring in the lingula.  ABDOMEN/PELVIS  No abnormal hypermetabolism in the liver, adrenal glands, spleen or pancreas. No hypermetabolic lymph nodes. CT images show the liver to be grossly unremarkable. Small stones layer in the gallbladder. Adrenal glands, kidneys, spleen, pancreas, stomach and small bowel are grossly unremarkable. Fluid is seen in the proximal colon. No free fluid.  SKELETON  No abnormal osseous hypermetabolism. Degenerative changes are seen in the spine.  IMPRESSION: 1. Disease progression as evidenced by an intensely hypermetabolic right upper lobe mass and hypermetabolic right hilar and right paratracheal lymph nodes. 2. Three-vessel coronary artery calcification. 3. Cholelithiasis.   Electronically Signed   By: Lorin Picket M.D.   On: 09/29/2014 11:37    ASSESSMENT: Carcinoma of lung, non-small cell type.  Stage IV patient was on NIVOLULAMAB Diarrhea most likely secondary to NIVOLULAMAB Intermittent diarrhea continues.  Hold chemotherapy.  MEDICAL DECISION MAKING:  All lab data has been reviewed Hypokalemia.  Oral potassium was started. Prednisone was started again for diarrhea.  All previous medications for diarrhea described by gastroenterologist has been started.  If diarrhea continues patient was advised to give me a call.  Will take patient off chemotherapy at present time  Patient expressed understanding and was in agreement with this plan. She also understands that She can call clinic at any time with any questions, concerns, or complaints.    Cancer of lung, upper lobe    Staging form: Lung, AJCC 7th Edition     Clinical: Stage IV (T1, N2, M1b) - Marni Griffon, MD   10/08/2014 8:24 AM

## 2014-10-19 ENCOUNTER — Inpatient Hospital Stay: Payer: Medicare Other

## 2014-10-19 ENCOUNTER — Inpatient Hospital Stay: Payer: Medicare Other | Attending: Oncology | Admitting: Oncology

## 2014-10-19 ENCOUNTER — Encounter: Payer: Self-pay | Admitting: Oncology

## 2014-10-19 VITALS — BP 157/85 | HR 93 | Temp 96.1°F | Wt 160.5 lb

## 2014-10-19 DIAGNOSIS — Z7952 Long term (current) use of systemic steroids: Secondary | ICD-10-CM | POA: Diagnosis not present

## 2014-10-19 DIAGNOSIS — C3412 Malignant neoplasm of upper lobe, left bronchus or lung: Secondary | ICD-10-CM

## 2014-10-19 DIAGNOSIS — Z800 Family history of malignant neoplasm of digestive organs: Secondary | ICD-10-CM | POA: Diagnosis not present

## 2014-10-19 DIAGNOSIS — R197 Diarrhea, unspecified: Secondary | ICD-10-CM

## 2014-10-19 DIAGNOSIS — F17210 Nicotine dependence, cigarettes, uncomplicated: Secondary | ICD-10-CM

## 2014-10-19 DIAGNOSIS — Z452 Encounter for adjustment and management of vascular access device: Secondary | ICD-10-CM | POA: Insufficient documentation

## 2014-10-19 DIAGNOSIS — T451X5S Adverse effect of antineoplastic and immunosuppressive drugs, sequela: Secondary | ICD-10-CM | POA: Insufficient documentation

## 2014-10-19 DIAGNOSIS — C787 Secondary malignant neoplasm of liver and intrahepatic bile duct: Secondary | ICD-10-CM | POA: Diagnosis not present

## 2014-10-19 DIAGNOSIS — Z79899 Other long term (current) drug therapy: Secondary | ICD-10-CM | POA: Insufficient documentation

## 2014-10-19 DIAGNOSIS — Z5111 Encounter for antineoplastic chemotherapy: Secondary | ICD-10-CM | POA: Insufficient documentation

## 2014-10-19 DIAGNOSIS — R109 Unspecified abdominal pain: Secondary | ICD-10-CM | POA: Insufficient documentation

## 2014-10-19 DIAGNOSIS — C771 Secondary and unspecified malignant neoplasm of intrathoracic lymph nodes: Secondary | ICD-10-CM | POA: Diagnosis not present

## 2014-10-19 DIAGNOSIS — R05 Cough: Secondary | ICD-10-CM | POA: Insufficient documentation

## 2014-10-19 DIAGNOSIS — C3411 Malignant neoplasm of upper lobe, right bronchus or lung: Secondary | ICD-10-CM | POA: Diagnosis not present

## 2014-10-19 LAB — COMPREHENSIVE METABOLIC PANEL
ALBUMIN: 3.7 g/dL (ref 3.5–5.0)
ALK PHOS: 55 U/L (ref 38–126)
ALT: 17 U/L (ref 14–54)
AST: 24 U/L (ref 15–41)
Anion gap: 6 (ref 5–15)
BUN: 10 mg/dL (ref 6–20)
CALCIUM: 8.2 mg/dL — AB (ref 8.9–10.3)
CO2: 25 mmol/L (ref 22–32)
CREATININE: 0.8 mg/dL (ref 0.44–1.00)
Chloride: 105 mmol/L (ref 101–111)
GFR calc Af Amer: >60 mL/min (ref >60)
GFR calc non Af Amer: >60 mL/min (ref >60)
Glucose, Bld: 91 mg/dL (ref 65–99)
Potassium: 3.9 mmol/L (ref 3.5–5.1)
SODIUM: 136 mmol/L (ref 135–145)
Total Bilirubin: 0.7 mg/dL (ref 0.3–1.2)
Total Protein: 6.4 g/dL — ABNORMAL LOW (ref 6.5–8.1)

## 2014-10-19 LAB — CBC WITH DIFFERENTIAL/PLATELET
Basophils Absolute: 0.1 10*3/uL (ref 0–0.1)
Basophils Relative: 1 %
EOS ABS: 0.1 10*3/uL (ref 0–0.7)
Eosinophils Relative: 2 %
HEMATOCRIT: 42 % (ref 35.0–47.0)
Hemoglobin: 13.7 g/dL (ref 12.0–16.0)
LYMPHS ABS: 1.4 10*3/uL (ref 1.0–3.6)
LYMPHS PCT: 16 %
MCH: 30.6 pg (ref 26.0–34.0)
MCHC: 32.6 g/dL (ref 32.0–36.0)
MCV: 93.7 fL (ref 80.0–100.0)
MONO ABS: 0.7 10*3/uL (ref 0.2–0.9)
Monocytes Relative: 8 %
NEUTROS ABS: 6.7 10*3/uL — AB (ref 1.4–6.5)
Neutrophils Relative %: 73 %
Platelets: 266 10*3/uL (ref 150–440)
RBC: 4.48 MIL/uL (ref 3.80–5.20)
RDW: 15.7 % — AB (ref 11.5–14.5)
WBC: 9.1 10*3/uL (ref 3.6–11.0)

## 2014-10-19 MED ORDER — HEPARIN SOD (PORK) LOCK FLUSH 100 UNIT/ML IV SOLN
500.0000 [IU] | Freq: Once | INTRAVENOUS | Status: AC
  Administered 2014-10-19: 500 [IU] via INTRAVENOUS

## 2014-10-19 MED ORDER — HEPARIN SOD (PORK) LOCK FLUSH 100 UNIT/ML IV SOLN
INTRAVENOUS | Status: AC
  Filled 2014-10-19: qty 5

## 2014-10-19 NOTE — Progress Notes (Signed)
Patient does not have living will.  Declined information.  Former smoker.

## 2014-10-19 NOTE — Progress Notes (Signed)
[place] @ [facility] Telephone:(336) [phone]  Fax:(336) [place] : 1935-05-25  MR#: [id number]  LPN#:[id number]  Patient Care Team: Adin Hector, MD as PCP - General (Internal Medicine)  CHIEF COMPLAINT:  Chief Complaint  Patient presents with  . Follow-up    Oncology History   Non-small cell carcinoma of lung T1 N2 M1stage IV diseasePET scan is consistent with one small lesion in the T11 as well as suspected lesion in liver (diagnoses was in July of 2015) right upper lobe tumor with metastases to the left liver and mediastinal lymph node  MRI of liver conforms metastases to the liver.  MRI of thoracic spine raises possibility of metastases to the spine however this is inconclusive study.  (August, 2015) 2.  Patient was started on carboplatinum and Taxol started on November 25, 2013. 6 cycle of chemotherapy with carboplatinum and Taxol.  Last treatment was March 16, 2014 7.because of progressing disease on CT scan patient has been started on NIVO   in  February of 2016 8.  Patient is off NIVO because of diarrhea (June, 2016) 7.  Will be started on carboplatinum and gemcitabine from July, 19th, 2016     Cancer, metastatic to liver   03/24/2014 Initial Diagnosis Cancer, metastatic to liver    Cancer of lung, upper lobe   03/24/2014 Initial Diagnosis Cancer of lung, upper lobe    Oncology Flowsheet 08/18/2014  nivolumab (OPDIVO) IV 200 mg    INTERVAL HISTORY:  79 year old lady who developed diarrhea secondary to   [place]. Marland Kitchen Patient was off prednisone and had again loose stool 2 were 3 per day.  Here for further follow-up no chills.  No fever.  No nausea.  No vomiting.. June 27, 016 Patient is here for ongoing evaluation continues to have diarrhea off and on.  Restart one more course of prednisone therapy.  Patient had a previous history of ES.  Previously patient did not receive hYOSCAMINE .  So would be started again.  No nausea no  vomiting not lost any weight patient is hypokalemic October 19, 2014 Patient's diarrhea is gradually improving.  Patient is coming off steroids.  No nausea no vomiting.  In view of the fact that patient had diarrhea twice after starting NIVOLULAMAB, that will be discontinued.  An possibility of carboplatin and gemcitabine being considered REVIEW OF SYSTEMS:   GENERAL:  Feels good.  Active.  No fevers, sweats or weight loss. PERFORMANCE STATUS (ECOG):  01 HEENT:  No visual changes, runny nose, sore throat, mouth sores or tenderness. Lungs: No shortness of breath or cough.  No hemoptysis. Cardiac:  No chest pain, palpitations, orthopnea, or PND. GI: Diarrhea is improving Abdominal discomfort.  No blood or mucus in the stool.  Watery stool 2 or 3 a day.  No chills.  No fever. GU:  No urgency, frequency, dysuria, or hematuria. Musculoskeletal:  No back pain.  No joint pain.  No muscle tenderness. Extremities:  No pain or swelling. Skin:  No rashes or skin changes. Neuro:  No headache, numbness or weakness, balance or coordination issues. Endocrine:  No diabetes, thyroid issues, hot flashes or night sweats. Psych:  No mood changes, depression or anxiety. Pain:  No focal pain. Review of systems:  All other systems reviewed and found to be negative.  As per HPI. Otherwise, a complete review of systems is negatve.  PAST MEDICAL HISTORY: Past Medical History  Diagnosis Date  . Lung cancer non small cell  PAST SURGICAL HISTORY: No past surgical history on file.  FAMILY HISTORY  Smoking History 0.5(1)60 years(1)  PFSH: Comments: family history of colon cancer cancer of uterus and history of hypertension and heart disease  Social History: positive tobacco  Smoker: Smoking cessation counseling performed  Smoker- Packs Per Day: 0.5  Smoker- How Many Years: 56-  Additional Past Medical and Surgical History: As mentioned above         ADVANCED DIRECTIVES: Patient does not have any  advanced healthcare directive. Information has been given.   HEALTH MAINTENANCE: History  Substance Use Topics  . Smoking status: Current Every Day Smoker -- 0.50 packs/day for 60 years  . Smokeless tobacco: Not on file  . Alcohol Use: Not on file       Allergies  Allergen Reactions  . Codeine Other (See Comments) and Nausea Only    GI Distress  . Hydrochlorothiazide Other (See Comments)    hypercalcemia  . Prednisone Swelling      OBJECTIVE:  Filed Vitals:   10/19/14 1052  BP: 157/85  Pulse: 93  Temp: 96.1 F (35.6 C)     Body mass index is 30.34 kg/(m^2).    ECOG FS:1 - Symptomatic but completely ambulatory  PHYSICAL EXAM: Goal status: Performance status is good.  Patient has not lost significant weight HEENT: No evidence of stomatitis. Sclera and conjunctivae :: No jaundice.   pale looking. Lungs: Air  entry equal on both sides.  No rhonchi.  No rales.  Cardiac: Heart sounds are normal.  No pericardial rub.  No murmur. Lymphatic system: Cervical, axillary, inguinal, lymph nodes not palpable GI: Abdomen is soft.  No ascites.  Liver spleen not palpable.  No tenderness.  Bowel sounds are within normal limit Lower extremity: No edema Neurological system: Higher functions, cranial nerves intact no evidence of peripheral neuropathy. Skin: No rash.  No ecchymosis.. NO clinical signs of dehydration LAB RESULTS:  Infusion on 10/19/2014  Component Date Value Ref Range Status  . WBC 10/19/2014 9.1  3.6 - 11.0 K/uL Final   A-LINE DRAW  . RBC 10/19/2014 4.48  3.80 - 5.20 MIL/uL Final  . Hemoglobin 10/19/2014 13.7  12.0 - 16.0 g/dL Final  . HCT 10/19/2014 42.0  35.0 - 47.0 % Final  . MCV 10/19/2014 93.7  80.0 - 100.0 fL Final  . MCH 10/19/2014 30.6  26.0 - 34.0 pg Final  . MCHC 10/19/2014 32.6  32.0 - 36.0 g/dL Final  . RDW 10/19/2014 15.7* 11.5 - 14.5 % Final  . Platelets 10/19/2014 266  150 - 440 K/uL Final  . Neutrophils Relative % 10/19/2014 73   Final  . Neutro  Abs 10/19/2014 6.7* 1.4 - 6.5 K/uL Final  . Lymphocytes Relative 10/19/2014 16   Final  . Lymphs Abs 10/19/2014 1.4  1.0 - 3.6 K/uL Final  . Monocytes Relative 10/19/2014 8   Final  . Monocytes Absolute 10/19/2014 0.7  0.2 - 0.9 K/uL Final  . Eosinophils Relative 10/19/2014 2   Final  . Eosinophils Absolute 10/19/2014 0.1  0 - 0.7 K/uL Final  . Basophils Relative 10/19/2014 1   Final  . Basophils Absolute 10/19/2014 0.1  0 - 0.1 K/uL Final  . Sodium 10/19/2014 136  135 - 145 mmol/L Final  . Potassium 10/19/2014 3.9  3.5 - 5.1 mmol/L Final  . Chloride 10/19/2014 105  101 - 111 mmol/L Final  . CO2 10/19/2014 25  22 - 32 mmol/L Final  . Glucose, Bld 10/19/2014 91  65 -  99 mg/dL Final  . BUN 10/19/2014 10  6 - 20 mg/dL Final  . Creatinine, Ser 10/19/2014 0.80  0.44 - 1.00 mg/dL Final  . Calcium 10/19/2014 8.2* 8.9 - 10.3 mg/dL Final  . Total Protein 10/19/2014 6.4* 6.5 - 8.1 g/dL Final  . Albumin 10/19/2014 3.7  3.5 - 5.0 g/dL Final  . AST 10/19/2014 24  15 - 41 U/L Final  . ALT 10/19/2014 17  14 - 54 U/L Final  . Alkaline Phosphatase 10/19/2014 55  38 - 126 U/L Final  . Total Bilirubin 10/19/2014 0.7  0.3 - 1.2 mg/dL Final  . GFR calc non Af Amer 10/19/2014 >60  >60 mL/min Final  . GFR calc Af Amer 10/19/2014 >60  >60 mL/min Final   Comment: (NOTE) The eGFR has been calculated using the CKD EPI equation. This calculation has not been validated in all clinical situations. eGFR's persistently <60 mL/min signify possible Chronic Kidney Disease.   . Anion gap 10/19/2014 6  5 - 15 Final      STUDIES: Nm Pet Image Restag (ps) Skull Base To Thigh  09/29/2014   CLINICAL DATA:  Subsequent treatment strategy for lung cancer metastatic to liver.  EXAM: NUCLEAR MEDICINE PET SKULL BASE TO THIGH  TECHNIQUE: 12.1 mCi F-18 FDG was injected intravenously. Full-ring PET imaging was performed from the skull base to thigh after the radiotracer. CT data was obtained and used for attenuation correction  and anatomic localization.  FASTING BLOOD GLUCOSE:  Value: 75 mg/dl  COMPARISON:  04/06/2014, MR abdomen 11/26/2013 and CT chest 10/02/2013.  FINDINGS: NECK  No hypermetabolic lymph nodes in the neck. CT images show no acute findings.  CHEST  There is mild hypermetabolism within the thyroid, decreased from prior. High right paratracheal lymph node measures 8 mm (CT image 68) with an SUV max of 6.7. There are hypermetabolic right hilar lymph nodes, not discretely seen on CT without IV contrast, with an SUV max of 6.0. A mass in the medial aspect of the anterior segment right upper lobe measures 2.0 x 3.1 cm with an SUV max of 22.5. Additional scattered tiny pulmonary nodules are too small for PET resolution and were likely present on 04/06/2014. CT images show a right IJ Port-A-Cath terminating in the low SVC. Three-vessel coronary artery calcification. Heart is at the upper limit of normal in size to mildly enlarged. No pericardial or pleural effusion. Scarring in the lingula.  ABDOMEN/PELVIS  No abnormal hypermetabolism in the liver, adrenal glands, spleen or pancreas. No hypermetabolic lymph nodes. CT images show the liver to be grossly unremarkable. Small stones layer in the gallbladder. Adrenal glands, kidneys, spleen, pancreas, stomach and small bowel are grossly unremarkable. Fluid is seen in the proximal colon. No free fluid.  SKELETON  No abnormal osseous hypermetabolism. Degenerative changes are seen in the spine.  IMPRESSION: 1. Disease progression as evidenced by an intensely hypermetabolic right upper lobe mass and hypermetabolic right hilar and right paratracheal lymph nodes. 2. Three-vessel coronary artery calcification. 3. Cholelithiasis.   Electronically Signed   By: Lorin Picket M.D.   On: 09/29/2014 11:37    ASSESSMENT: Carcinoma of lung, non-small cell type.  Stage IV patient was on NIVOLULAMAB Diarrhea most likely secondary to [place] Will discontinue NIVOLULAMAB\consider possibility  of chemotherapy with carboplatinum and gemcitabine  MEDICAL DECISION MAKING:  Lab data has been revealed Intent of chemotherapy is palliation and relief in symptoms and extending survival All the side effects of chemotherapy including myelosuppression, alopecia, nausea vomiting fatigue  weakness.  Secondary infection, and   peripheral neuropathy .  Has been discussed in details. Informal consent has been obtained and will be documented by nurses in the chart   Cancer of lung, upper lobe   Staging form: Lung, AJCC 7th Edition     Clinical: Stage IV (T1, N2, M1b) - Marni Griffon, MD   10/19/2014 7:47 PM

## 2014-10-27 ENCOUNTER — Encounter: Payer: Self-pay | Admitting: Oncology

## 2014-10-27 ENCOUNTER — Other Ambulatory Visit: Payer: Medicare Other

## 2014-10-27 ENCOUNTER — Other Ambulatory Visit: Payer: Self-pay | Admitting: *Deleted

## 2014-10-27 ENCOUNTER — Inpatient Hospital Stay: Payer: Medicare Other

## 2014-10-27 ENCOUNTER — Inpatient Hospital Stay (HOSPITAL_BASED_OUTPATIENT_CLINIC_OR_DEPARTMENT_OTHER): Payer: Medicare Other | Admitting: Oncology

## 2014-10-27 VITALS — BP 143/81 | HR 99 | Temp 96.3°F | Wt 159.6 lb

## 2014-10-27 DIAGNOSIS — C3411 Malignant neoplasm of upper lobe, right bronchus or lung: Secondary | ICD-10-CM

## 2014-10-27 DIAGNOSIS — F17210 Nicotine dependence, cigarettes, uncomplicated: Secondary | ICD-10-CM

## 2014-10-27 DIAGNOSIS — K521 Toxic gastroenteritis and colitis: Secondary | ICD-10-CM

## 2014-10-27 DIAGNOSIS — R109 Unspecified abdominal pain: Secondary | ICD-10-CM

## 2014-10-27 DIAGNOSIS — C771 Secondary and unspecified malignant neoplasm of intrathoracic lymph nodes: Secondary | ICD-10-CM | POA: Diagnosis not present

## 2014-10-27 DIAGNOSIS — T451X5S Adverse effect of antineoplastic and immunosuppressive drugs, sequela: Secondary | ICD-10-CM

## 2014-10-27 DIAGNOSIS — C787 Secondary malignant neoplasm of liver and intrahepatic bile duct: Secondary | ICD-10-CM | POA: Diagnosis not present

## 2014-10-27 DIAGNOSIS — Z7952 Long term (current) use of systemic steroids: Secondary | ICD-10-CM

## 2014-10-27 DIAGNOSIS — R05 Cough: Secondary | ICD-10-CM

## 2014-10-27 DIAGNOSIS — Z5111 Encounter for antineoplastic chemotherapy: Secondary | ICD-10-CM | POA: Diagnosis not present

## 2014-10-27 DIAGNOSIS — C3412 Malignant neoplasm of upper lobe, left bronchus or lung: Secondary | ICD-10-CM

## 2014-10-27 MED ORDER — CARBOPLATIN CHEMO INJECTION 450 MG/45ML
390.0000 mg | Freq: Once | INTRAVENOUS | Status: AC
  Administered 2014-10-27: 390 mg via INTRAVENOUS
  Filled 2014-10-27: qty 39

## 2014-10-27 MED ORDER — HEPARIN SOD (PORK) LOCK FLUSH 100 UNIT/ML IV SOLN
500.0000 [IU] | Freq: Once | INTRAVENOUS | Status: AC | PRN
  Administered 2014-10-27: 500 [IU]
  Filled 2014-10-27: qty 5

## 2014-10-27 MED ORDER — SODIUM CHLORIDE 0.9 % IV SOLN
1000.0000 mg/m2 | Freq: Once | INTRAVENOUS | Status: AC
  Administered 2014-10-27: 1786 mg via INTRAVENOUS
  Filled 2014-10-27: qty 47

## 2014-10-27 MED ORDER — PREDNISONE 20 MG PO TABS: 20.0000 mg | ORAL_TABLET | Freq: Every day | ORAL | Status: DC

## 2014-10-27 MED ORDER — PALONOSETRON HCL INJECTION 0.25 MG/5ML
0.2500 mg | Freq: Once | INTRAVENOUS | Status: AC
  Administered 2014-10-27: 0.25 mg via INTRAVENOUS
  Filled 2014-10-27: qty 5

## 2014-10-27 MED ORDER — SODIUM CHLORIDE 0.9 % IV SOLN
Freq: Once | INTRAVENOUS | Status: AC
  Administered 2014-10-27: 10:00:00 via INTRAVENOUS
  Filled 2014-10-27: qty 5

## 2014-10-27 MED ORDER — SODIUM CHLORIDE 0.9 % IV SOLN
Freq: Once | INTRAVENOUS | Status: AC
  Administered 2014-10-27: 10:00:00 via INTRAVENOUS
  Filled 2014-10-27: qty 1000

## 2014-10-27 MED ORDER — SODIUM CHLORIDE 0.9 % IJ SOLN
10.0000 mL | INTRAMUSCULAR | Status: DC | PRN
  Administered 2014-10-27: 10 mL
  Filled 2014-10-27: qty 10

## 2014-10-27 NOTE — Progress Notes (Signed)
   10/27/14 1140  Clinical Encounter Type  Visited With Patient  Visit Type Initial  Spiritual Encounters  Spiritual Needs Prayer  Provided pastoral support and compassion in the cancer center.  [place] [phone]

## 2014-10-27 NOTE — Progress Notes (Signed)
[place] @ [facility] Telephone:(336) [phone]  Fax:(336) [place]: December 17, 1935  MR#: [id number]  TKW#:[id number]  Patient Care Team: Adin Hector, MD as PCP - General (Internal Medicine)  CHIEF COMPLAINT:  Chief Complaint  Patient presents with  . Follow-up    Oncology History   Non-small cell carcinoma of lung T1 N2 M1stage IV diseasePET scan is consistent with one small lesion in the T11 as well as suspected lesion in liver (diagnoses was in July of 2015) right upper lobe tumor with metastases to the left liver and mediastinal lymph node  MRI of liver conforms metastases to the liver.  MRI of thoracic spine raises possibility of metastases to the spine however this is inconclusive study.  (August, 2015) 2.  Patient was started on carboplatinum and Taxol started on November 25, 2013. 6 cycle of chemotherapy with carboplatinum and Taxol.  Last treatment was March 16, 2014 7.because of progressing disease on CT scan patient has been started on NIVO   in  February of 2016 8.  Patient is off NIVO because of diarrhea (June, 2016) 7.  Will be started on carboplatinum and gemcitabine from July, 19th, 2016     Cancer, metastatic to liver   03/24/2014 Initial Diagnosis Cancer, metastatic to liver    Cancer of lung, upper lobe   03/24/2014 Initial Diagnosis Cancer of lung, upper lobe    Oncology Flowsheet 08/18/2014  nivolumab (OPDIVO) IV 200 mg    INTERVAL HISTORY:  79 year old lady who developed diarrhea secondary to   [place]. Marland Kitchen Patient was off prednisone and had again loose stool 2 were 3 per day.  Here for further follow-up no chills.  No fever.  No nausea.  No vomiting.. June 27, 016 Patient is here for ongoing evaluation continues to have diarrhea off and on.  Restart one more course of prednisone therapy.  Patient had a previous history of ES.  Previously patient did not receive hYOSCAMINE .  So would be started again.  No nausea no  vomiting not lost any weight patient is hypokalemic October 19, 2014 Patient's diarrhea is gradually improving.  Patient is coming off steroids.  No nausea no vomiting.  In view of the fact that patient had diarrhea twice after starting NIVOLULAMAB, that will be discontinued.  An possibility of carboplatin and gemcitabine being considered October 27, 2014 Patient is here for continues to diarrhea have not Patient goes off steroid.  So patient would be On prednisone 20 mg daily.  No nausea no vomiting no abdominal pain no discomfort.  No chills.  No fever.  Continues to have dry hacking cough has a progressing cancer REVIEW OF SYSTEMS:   GENERAL:  Feels good.  Active.  No fevers, sweats or weight loss. PERFORMANCE STATUS (ECOG):  01 HEENT:  No visual changes, runny nose, sore throat, mouth sores or tenderness. Lungs: No shortness of breath or cough.  No hemoptysis. Cardiac:  No chest pain, palpitations, orthopnea, or PND. GI: Diarrhea is improving Abdominal discomfort.  No blood or mucus in the stool.  Watery stool 2 or 3 a day.  No chills.  No fever. GU:  No urgency, frequency, dysuria, or hematuria. Musculoskeletal:  No back pain.  No joint pain.  No muscle tenderness. Extremities:  No pain or swelling. Skin:  No rashes or skin changes. Neuro:  No headache, numbness or weakness, balance or coordination issues. Endocrine:  No diabetes, thyroid issues, hot flashes or night sweats. Psych:  No mood changes, depression or anxiety. Pain:  No focal pain. Review of systems:  All other systems reviewed and found to be negative.  As per HPI. Otherwise, a complete review of systems is negatve.  PAST MEDICAL HISTORY: Past Medical History  Diagnosis Date  . Lung cancer non small cell    PAST SURGICAL HISTORY: No significant past surgical history other than mentioned below FAMILY HISTORY There is no significant family history of breast cancer, ovarian cancer, colon cancer Smoking History 0.5(1)60  years(1)  PFSH: Comments: family history of colon cancer cancer of uterus and history of hypertension and heart disease  Social History: positive tobacco  Smoker: Smoking cessation counseling performed  Smoker- Packs Per Day: 0.5  Smoker- How Many Years: 10-  Additional Past Medical and Surgical History: As mentioned above         ADVANCED DIRECTIVES: Patient does not have any advanced healthcare directive. Information has been given.   HEALTH MAINTENANCE: History  Substance Use Topics  . Smoking status: Current Every Day Smoker -- 0.50 packs/day for 60 years  . Smokeless tobacco: Not on file  . Alcohol Use: Not on file       Allergies  Allergen Reactions  . Codeine Other (See Comments) and Nausea Only    GI Distress  . Hydrochlorothiazide Other (See Comments)    hypercalcemia  . Prednisone Swelling      OBJECTIVE:  Filed Vitals:   10/27/14 0856  BP: 143/81  Pulse: 99  Temp: 96.3 F (35.7 C)     Body mass index is 30.17 kg/(m^2).    ECOG FS:1 - Symptomatic but completely ambulatory  PHYSICAL EXAM: Goal status: Performance status is good.  Patient has not lost significant weight HEENT: No evidence of stomatitis. Sclera and conjunctivae :: No jaundice.   pale looking. Lungs: Air  entry equal on both sides.  No rhonchi.  No rales.  Cardiac: Heart sounds are normal.  No pericardial rub.  No murmur. Lymphatic system: Cervical, axillary, inguinal, lymph nodes not palpable GI: Abdomen is soft.  No ascites.  Liver spleen not palpable.  No tenderness.  Bowel sounds are within normal limit Lower extremity: No edema Neurological system: Higher functions, cranial nerves intact no evidence of peripheral neuropathy. Skin: No rash.  No ecchymosis.. NO clinical signs of dehydration LAB RESULTS:  No visits with results within 3 Day(s) from this visit. Latest known visit with results is:  Infusion on 10/19/2014  Component Date Value Ref Range Status  . WBC 10/19/2014  9.1  3.6 - 11.0 K/uL Final   A-LINE DRAW  . RBC 10/19/2014 4.48  3.80 - 5.20 MIL/uL Final  . Hemoglobin 10/19/2014 13.7  12.0 - 16.0 g/dL Final  . HCT 10/19/2014 42.0  35.0 - 47.0 % Final  . MCV 10/19/2014 93.7  80.0 - 100.0 fL Final  . MCH 10/19/2014 30.6  26.0 - 34.0 pg Final  . MCHC 10/19/2014 32.6  32.0 - 36.0 g/dL Final  . RDW 10/19/2014 15.7* 11.5 - 14.5 % Final  . Platelets 10/19/2014 266  150 - 440 K/uL Final  . Neutrophils Relative % 10/19/2014 73   Final  . Neutro Abs 10/19/2014 6.7* 1.4 - 6.5 K/uL Final  . Lymphocytes Relative 10/19/2014 16   Final  . Lymphs Abs 10/19/2014 1.4  1.0 - 3.6 K/uL Final  . Monocytes Relative 10/19/2014 8   Final  . Monocytes Absolute 10/19/2014 0.7  0.2 - 0.9 K/uL Final  . Eosinophils Relative 10/19/2014 2  Final  . Eosinophils Absolute 10/19/2014 0.1  0 - 0.7 K/uL Final  . Basophils Relative 10/19/2014 1   Final  . Basophils Absolute 10/19/2014 0.1  0 - 0.1 K/uL Final  . Sodium 10/19/2014 136  135 - 145 mmol/L Final  . Potassium 10/19/2014 3.9  3.5 - 5.1 mmol/L Final  . Chloride 10/19/2014 105  101 - 111 mmol/L Final  . CO2 10/19/2014 25  22 - 32 mmol/L Final  . Glucose, Bld 10/19/2014 91  65 - 99 mg/dL Final  . BUN 10/19/2014 10  6 - 20 mg/dL Final  . Creatinine, Ser 10/19/2014 0.80  0.44 - 1.00 mg/dL Final  . Calcium 10/19/2014 8.2* 8.9 - 10.3 mg/dL Final  . Total Protein 10/19/2014 6.4* 6.5 - 8.1 g/dL Final  . Albumin 10/19/2014 3.7  3.5 - 5.0 g/dL Final  . AST 10/19/2014 24  15 - 41 U/L Final  . ALT 10/19/2014 17  14 - 54 U/L Final  . Alkaline Phosphatase 10/19/2014 55  38 - 126 U/L Final  . Total Bilirubin 10/19/2014 0.7  0.3 - 1.2 mg/dL Final  . GFR calc non Af Amer 10/19/2014 >60  >60 mL/min Final  . GFR calc Af Amer 10/19/2014 >60  >60 mL/min Final   Comment: (NOTE) The eGFR has been calculated using the CKD EPI equation. This calculation has not been validated in all clinical situations. eGFR's persistently <60 mL/min signify  possible Chronic Kidney Disease.   . Anion gap 10/19/2014 6  5 - 15 Final      STUDIES: Nm Pet Image Restag (ps) Skull Base To Thigh  09/29/2014   CLINICAL DATA:  Subsequent treatment strategy for lung cancer metastatic to liver.  EXAM: NUCLEAR MEDICINE PET SKULL BASE TO THIGH  TECHNIQUE: 12.1 mCi F-18 FDG was injected intravenously. Full-ring PET imaging was performed from the skull base to thigh after the radiotracer. CT data was obtained and used for attenuation correction and anatomic localization.  FASTING BLOOD GLUCOSE:  Value: 75 mg/dl  COMPARISON:  04/06/2014, MR abdomen 11/26/2013 and CT chest 10/02/2013.  FINDINGS: NECK  No hypermetabolic lymph nodes in the neck. CT images show no acute findings.  CHEST  There is mild hypermetabolism within the thyroid, decreased from prior. High right paratracheal lymph node measures 8 mm (CT image 68) with an SUV max of 6.7. There are hypermetabolic right hilar lymph nodes, not discretely seen on CT without IV contrast, with an SUV max of 6.0. A mass in the medial aspect of the anterior segment right upper lobe measures 2.0 x 3.1 cm with an SUV max of 22.5. Additional scattered tiny pulmonary nodules are too small for PET resolution and were likely present on 04/06/2014. CT images show a right IJ Port-A-Cath terminating in the low SVC. Three-vessel coronary artery calcification. Heart is at the upper limit of normal in size to mildly enlarged. No pericardial or pleural effusion. Scarring in the lingula.  ABDOMEN/PELVIS  No abnormal hypermetabolism in the liver, adrenal glands, spleen or pancreas. No hypermetabolic lymph nodes. CT images show the liver to be grossly unremarkable. Small stones layer in the gallbladder. Adrenal glands, kidneys, spleen, pancreas, stomach and small bowel are grossly unremarkable. Fluid is seen in the proximal colon. No free fluid.  SKELETON  No abnormal osseous hypermetabolism. Degenerative changes are seen in the spine.   IMPRESSION: 1. Disease progression as evidenced by an intensely hypermetabolic right upper lobe mass and hypermetabolic right hilar and right paratracheal lymph nodes. 2. Three-vessel coronary  artery calcification. 3. Cholelithiasis.   Electronically Signed   By: Lorin Picket M.D.   On: 09/29/2014 11:37    ASSESSMENT: Carcinoma of lung, non-small cell type.  Stage IV patient was on NIVOLULAMAB Diarrhea most likely secondary to [facility] Will discontinue NIVOLULAMAB\consider possibility of chemotherapy with carboplatinum and gemcitabine We will continue prednisone 20 mg daily.  Continue hyoscyamine Initiate chemotherapy.  Carboplatinum and gemcitabine  MEDICAL DECISION MAKING:  Lab data has been revealed Intent of chemotherapy is palliation and relief in symptoms and extending survival All the side effects of chemotherapy including myelosuppression, alopecia, nausea vomiting fatigue weakness.  Secondary infection, and   peripheral neuropathy .  Has been discussed in details. Informal consent has been obtained and will be documented by nurses in the chart   Cancer of lung, upper lobe   Staging form: Lung, AJCC 7th Edition     Clinical: Stage IV (T1, N2, M1b) - Marni Griffon, MD   10/27/2014 9:37 AM

## 2014-10-27 NOTE — Progress Notes (Signed)
Patient does not have living will.  Current smoker.

## 2014-11-03 ENCOUNTER — Encounter: Payer: Self-pay | Admitting: Oncology

## 2014-11-03 ENCOUNTER — Inpatient Hospital Stay (HOSPITAL_BASED_OUTPATIENT_CLINIC_OR_DEPARTMENT_OTHER): Payer: Medicare Other | Admitting: Oncology

## 2014-11-03 ENCOUNTER — Inpatient Hospital Stay: Payer: Medicare Other

## 2014-11-03 VITALS — BP 146/90 | HR 76 | Temp 96.9°F | Resp 18 | Wt 156.7 lb

## 2014-11-03 DIAGNOSIS — C3411 Malignant neoplasm of upper lobe, right bronchus or lung: Secondary | ICD-10-CM | POA: Diagnosis not present

## 2014-11-03 DIAGNOSIS — Z5111 Encounter for antineoplastic chemotherapy: Secondary | ICD-10-CM | POA: Diagnosis not present

## 2014-11-03 DIAGNOSIS — Z800 Family history of malignant neoplasm of digestive organs: Secondary | ICD-10-CM

## 2014-11-03 DIAGNOSIS — C771 Secondary and unspecified malignant neoplasm of intrathoracic lymph nodes: Secondary | ICD-10-CM | POA: Diagnosis not present

## 2014-11-03 DIAGNOSIS — Z79899 Other long term (current) drug therapy: Secondary | ICD-10-CM

## 2014-11-03 DIAGNOSIS — Z7952 Long term (current) use of systemic steroids: Secondary | ICD-10-CM

## 2014-11-03 DIAGNOSIS — C3412 Malignant neoplasm of upper lobe, left bronchus or lung: Secondary | ICD-10-CM

## 2014-11-03 DIAGNOSIS — C787 Secondary malignant neoplasm of liver and intrahepatic bile duct: Secondary | ICD-10-CM

## 2014-11-03 DIAGNOSIS — R109 Unspecified abdominal pain: Secondary | ICD-10-CM

## 2014-11-03 DIAGNOSIS — R197 Diarrhea, unspecified: Secondary | ICD-10-CM | POA: Diagnosis not present

## 2014-11-03 DIAGNOSIS — T451X5S Adverse effect of antineoplastic and immunosuppressive drugs, sequela: Secondary | ICD-10-CM

## 2014-11-03 DIAGNOSIS — F17210 Nicotine dependence, cigarettes, uncomplicated: Secondary | ICD-10-CM

## 2014-11-03 LAB — CBC WITH DIFFERENTIAL/PLATELET
Basophils Absolute: 0 10*3/uL (ref 0–0.1)
Basophils Relative: 1 %
EOS ABS: 0.1 10*3/uL (ref 0–0.7)
EOS PCT: 2 %
HCT: 38.8 % (ref 35.0–47.0)
Hemoglobin: 12.9 g/dL (ref 12.0–16.0)
LYMPHS ABS: 1.4 10*3/uL (ref 1.0–3.6)
Lymphocytes Relative: 29 %
MCH: 31 pg (ref 26.0–34.0)
MCHC: 33.1 g/dL (ref 32.0–36.0)
MCV: 93.5 fL (ref 80.0–100.0)
Monocytes Absolute: 0.1 10*3/uL — ABNORMAL LOW (ref 0.2–0.9)
Monocytes Relative: 2 %
Neutro Abs: 3.2 10*3/uL (ref 1.4–6.5)
Neutrophils Relative %: 66 %
Platelets: 210 10*3/uL (ref 150–440)
RBC: 4.16 MIL/uL (ref 3.80–5.20)
RDW: 14.7 % — ABNORMAL HIGH (ref 11.5–14.5)
WBC: 4.9 10*3/uL (ref 3.6–11.0)

## 2014-11-03 LAB — POTASSIUM: POTASSIUM: 3.5 mmol/L (ref 3.5–5.1)

## 2014-11-03 LAB — MAGNESIUM: MAGNESIUM: 1.6 mg/dL — AB (ref 1.7–2.4)

## 2014-11-03 MED ORDER — SODIUM CHLORIDE 0.9 % IV SOLN
Freq: Once | INTRAVENOUS | Status: AC
  Administered 2014-11-03: 11:00:00 via INTRAVENOUS
  Filled 2014-11-03: qty 4

## 2014-11-03 MED ORDER — HEPARIN SOD (PORK) LOCK FLUSH 100 UNIT/ML IV SOLN
500.0000 [IU] | Freq: Once | INTRAVENOUS | Status: AC | PRN
  Administered 2014-11-03: 500 [IU]

## 2014-11-03 MED ORDER — SODIUM CHLORIDE 0.9 % IV SOLN
1000.0000 mg/m2 | Freq: Once | INTRAVENOUS | Status: AC
  Administered 2014-11-03: 1786 mg via INTRAVENOUS
  Filled 2014-11-03: qty 47

## 2014-11-03 MED ORDER — SODIUM CHLORIDE 0.9 % IJ SOLN
10.0000 mL | INTRAMUSCULAR | Status: DC | PRN
  Filled 2014-11-03: qty 10

## 2014-11-03 MED ORDER — PREDNISONE 20 MG PO TABS: 20.0000 mg | ORAL_TABLET | ORAL | Status: DC

## 2014-11-03 MED ORDER — SODIUM CHLORIDE 0.9 % IV SOLN
Freq: Once | INTRAVENOUS | Status: AC
  Administered 2014-11-03: 11:00:00 via INTRAVENOUS
  Filled 2014-11-03: qty 1000

## 2014-11-03 NOTE — Progress Notes (Signed)
[place] @ [facility] [phone]  Fax:(336) [place]: [birth year]  MR#: [id number]  WIO#:[id number]  Patient Care Team: Adin Hector, MD as PCP - General (Internal Medicine)  CHIEF COMPLAINT:  Chief Complaint  Patient presents with  . Follow-up    Oncology History   Non-small cell carcinoma of lung T1 N2 M1stage IV diseasePET scan is consistent with one small lesion in the T11 as well as suspected lesion in liver (diagnoses was in July of 2015) right upper lobe tumor with metastases to the left liver and mediastinal lymph node  MRI of liver conforms metastases to the liver.  MRI of thoracic spine raises possibility of metastases to the spine however this is inconclusive study.  (August, 2015) 2.  Patient was started on carboplatinum and Taxol started on November 25, 2013. 6 cycle of chemotherapy with carboplatinum and Taxol.  Last treatment was March 16, 2014 7.because of progressing disease on CT scan patient has been started on NIVO   in  February of 2016 8.  Patient is off NIVO because of diarrhea (June, 2016) 7.  Will be started on carboplatinum and gemcitabine from July, 19th, 2016     Cancer, metastatic to liver   03/24/2014 Initial Diagnosis Cancer, metastatic to liver    Cancer of lung, upper lobe   03/24/2014 Initial Diagnosis Cancer of lung, upper lobe    Oncology Flowsheet 08/18/2014 10/27/2014 11/03/2014  Day, Cycle - Day 1, Cycle 1 Day 8, Cycle 1  CARBOplatin (PARAPLATIN) IV - 390 mg -  dexamethasone (DECADRON) IV - [ 12 mg ] -  fosaprepitant (EMEND) IV - [ 150 mg ] -  gemcitabine (GEMZAR) IV - 1,000 mg/m2 1,000 mg/m2  nivolumab (OPDIVO) IV 200 mg - -  ondansetron (ZOFRAN) IV - - [ 8 mg ]  palonosetron (ALOXI) IV - 0.25 mg -    INTERVAL HISTORY:  79 year old lady who developed diarrhea secondary to   [place]. Marland Kitchen Patient was off prednisone and had again loose stool 2 were 3 per day.  Here for further follow-up no  chills.  No fever.  No nausea.  No vomiting.. November 03, 2014 Patient is here for ongoing evaluation and day 8 chemotherapy with gemcitabine.  Patient is being seen because of previous history of diarrhea.  Diarrhea is resolved.  Patient is on prednisone 10 mg and hyoscyamine .  She is feeling better.  Appetite is improving.  No abdominal pain.  No cough.  No shortness of breath. REVIEW OF SYSTEMS:   GENERAL:  Feels good.  Active.  No fevers, sweats or weight loss. PERFORMANCE STATUS (ECOG):  01 HEENT:  No visual changes, runny nose, sore throat, mouth sores or tenderness. Lungs: No shortness of breath or cough.  No hemoptysis. Cardiac:  No chest pain, palpitations, orthopnea, or PND. GI: Diarrhea is improving Abdominal discomfort.  No blood or mucus in the stool.  Watery stool 2 or 3 a day.  No chills.  No fever. GU:  No urgency, frequency, dysuria, or hematuria. Musculoskeletal:  No back pain.  No joint pain.  No muscle tenderness. Extremities:  No pain or swelling. Skin:  No rashes or skin changes. Neuro:  No headache, numbness or weakness, balance or coordination issues. Endocrine:  No diabetes, thyroid issues, hot flashes or night sweats. Psych:  No mood changes, depression or anxiety. Pain:  No focal pain. Review of systems:  All other systems reviewed and found to be negative.  As per HPI. Otherwise, a complete review of systems is negatve.  PAST MEDICAL HISTORY: Past Medical History  Diagnosis Date  . Lung cancer non small cell    PAST SURGICAL HISTORY: No significant past surgical history other than mentioned below FAMILY HISTORY There is no significant family history of breast cancer, ovarian cancer, colon cancer Smoking History 0.5(1)60 years(1)  PFSH: Comments: family history of colon cancer cancer of uterus and history of hypertension and heart disease  Social History: positive tobacco  Smoker: Smoking cessation counseling performed  Smoker- Packs Per Day: 0.5    Smoker- How Many Years: 29-  Additional Past Medical and Surgical History: As mentioned above         ADVANCED DIRECTIVES: Patient does not have any advanced healthcare directive. Information has been given.   HEALTH MAINTENANCE: History  Substance Use Topics  . Smoking status: Current Every Day Smoker -- 0.50 packs/day for 60 years  . Smokeless tobacco: Not on file  . Alcohol Use: Not on file       Allergies  Allergen Reactions  . Codeine Other (See Comments) and Nausea Only    GI Distress  . Hydrochlorothiazide Other (See Comments)    hypercalcemia  . Prednisone Swelling      OBJECTIVE:  Filed Vitals:   11/03/14 1001  BP: 146/90  Pulse: 76  Temp: 96.9 F (36.1 C)  Resp: 18     Body mass index is 29.63 kg/(m^2).    ECOG FS:1 - Symptomatic but completely ambulatory  PHYSICAL EXAM: Goal status: Performance status is good.  Patient has not lost significant weight HEENT: No evidence of stomatitis. Sclera and conjunctivae :: No jaundice.   pale looking. Lungs: Air  entry equal on both sides.  No rhonchi.  No rales.  Cardiac: Heart sounds are normal.  No pericardial rub.  No murmur. Lymphatic system: Cervical, axillary, inguinal, lymph nodes not palpable GI: Abdomen is soft.  No ascites.  Liver spleen not palpable.  No tenderness.  Bowel sounds are within normal limit Lower extremity: No edema Neurological system: Higher functions, cranial nerves intact no evidence of peripheral neuropathy. Skin: No rash.  No ecchymosis.. NO clinical signs of dehydration LAB RESULTS:  Appointment on 11/03/2014  Component Date Value Ref Range Status  . WBC 11/03/2014 4.9  3.6 - 11.0 K/uL Final   A-LINE DRAW  . RBC 11/03/2014 4.16  3.80 - 5.20 MIL/uL Final  . Hemoglobin 11/03/2014 12.9  12.0 - 16.0 g/dL Final  . HCT 11/03/2014 38.8  35.0 - 47.0 % Final  . MCV 11/03/2014 93.5  80.0 - 100.0 fL Final  . MCH 11/03/2014 31.0  26.0 - 34.0 pg Final  . MCHC 11/03/2014 33.1  32.0 -  36.0 g/dL Final  . RDW 11/03/2014 14.7* 11.5 - 14.5 % Final  . Platelets 11/03/2014 210  150 - 440 K/uL Final  . Neutrophils Relative % 11/03/2014 66   Final  . Neutro Abs 11/03/2014 3.2  1.4 - 6.5 K/uL Final  . Lymphocytes Relative 11/03/2014 29   Final  . Lymphs Abs 11/03/2014 1.4  1.0 - 3.6 K/uL Final  . Monocytes Relative 11/03/2014 2   Final  . Monocytes Absolute 11/03/2014 0.1* 0.2 - 0.9 K/uL Final  . Eosinophils Relative 11/03/2014 2   Final  . Eosinophils Absolute 11/03/2014 0.1  0 - 0.7 K/uL Final  . Basophils Relative 11/03/2014 1   Final  . Basophils Absolute 11/03/2014 0.0  0 - 0.1 K/uL Final  . Magnesium  11/03/2014 1.6* 1.7 - 2.4 mg/dL Final  . Potassium 11/03/2014 3.5  3.5 - 5.1 mmol/L Final      STUDIES: No results found.  ASSESSMENT: Carcinoma of lung, non-small cell type.  Stage IV patient was on NIVOLULAMAB Diarrhea most likely secondary to [facility] Will discontinue NIVOLULAMAB\consider possibility of chemotherapy with carboplatinum and gemcitabine She  is on prednisone 20 mg daily. And hyoscyamine Initiate chemotherapy.  Carboplatinum and gemcitabine patient will continue gemcitabine day 8 chemotherapy' \\was'$  advised to decrease prednisone to every other day.  And if diarrhea continues to improve then after 2 weeks patient can stop taking prednisone therapy  If diarrhea recurs that patient will go back on prednisone 20 mg daily MEDICAL DECISION MAKING:  Lab data has been reviewed Continue  oral potassium    Cancer of lung, upper lobe   Staging form: Lung, AJCC 7th Edition     Clinical: Stage IV (T1, N2, M1b) - Marni Griffon, MD   11/03/2014 5:04 PM

## 2014-11-03 NOTE — Progress Notes (Signed)
Patient does not have living will.  Currently smokes.  Patient reports diarrhea has stopped.

## 2014-11-03 NOTE — Progress Notes (Signed)
Patient did not have BMP results as they were not ordered for today.  Contacted Dr. Oliva Bustard nurse Angie Fava and may proceed with treatment

## 2014-11-10 ENCOUNTER — Inpatient Hospital Stay: Payer: Medicare Other | Attending: Oncology

## 2014-11-10 DIAGNOSIS — Z800 Family history of malignant neoplasm of digestive organs: Secondary | ICD-10-CM | POA: Diagnosis not present

## 2014-11-10 DIAGNOSIS — C787 Secondary malignant neoplasm of liver and intrahepatic bile duct: Secondary | ICD-10-CM | POA: Insufficient documentation

## 2014-11-10 DIAGNOSIS — C3411 Malignant neoplasm of upper lobe, right bronchus or lung: Secondary | ICD-10-CM | POA: Insufficient documentation

## 2014-11-10 DIAGNOSIS — R197 Diarrhea, unspecified: Secondary | ICD-10-CM | POA: Insufficient documentation

## 2014-11-10 DIAGNOSIS — C771 Secondary and unspecified malignant neoplasm of intrathoracic lymph nodes: Secondary | ICD-10-CM | POA: Diagnosis not present

## 2014-11-10 DIAGNOSIS — F17210 Nicotine dependence, cigarettes, uncomplicated: Secondary | ICD-10-CM | POA: Diagnosis not present

## 2014-11-10 DIAGNOSIS — Z7952 Long term (current) use of systemic steroids: Secondary | ICD-10-CM | POA: Insufficient documentation

## 2014-11-10 DIAGNOSIS — Z5111 Encounter for antineoplastic chemotherapy: Secondary | ICD-10-CM | POA: Insufficient documentation

## 2014-11-10 LAB — CBC WITH DIFFERENTIAL/PLATELET
BASOS PCT: 0 %
Basophils Absolute: 0 10*3/uL (ref 0–0.1)
EOS ABS: 0 10*3/uL (ref 0–0.7)
Eosinophils Relative: 1 %
HCT: 37.2 % (ref 35.0–47.0)
HEMOGLOBIN: 12.6 g/dL (ref 12.0–16.0)
Lymphocytes Relative: 68 %
Lymphs Abs: 2.3 10*3/uL (ref 1.0–3.6)
MCH: 30.9 pg (ref 26.0–34.0)
MCHC: 33.8 g/dL (ref 32.0–36.0)
MCV: 91.4 fL (ref 80.0–100.0)
Monocytes Absolute: 0.2 10*3/uL (ref 0.2–0.9)
Monocytes Relative: 5 %
NEUTROS ABS: 0.9 10*3/uL — AB (ref 1.4–6.5)
NEUTROS PCT: 26 %
PLATELETS: 35 10*3/uL — AB (ref 150–440)
RBC: 4.07 MIL/uL (ref 3.80–5.20)
RDW: 14.5 % (ref 11.5–14.5)
WBC: 3.4 10*3/uL — AB (ref 3.6–11.0)

## 2014-11-17 ENCOUNTER — Inpatient Hospital Stay (HOSPITAL_BASED_OUTPATIENT_CLINIC_OR_DEPARTMENT_OTHER): Payer: Medicare Other | Admitting: Oncology

## 2014-11-17 ENCOUNTER — Inpatient Hospital Stay: Payer: Medicare Other

## 2014-11-17 ENCOUNTER — Encounter: Payer: Self-pay | Admitting: Oncology

## 2014-11-17 VITALS — BP 145/88 | HR 87 | Temp 96.4°F | Wt 158.2 lb

## 2014-11-17 DIAGNOSIS — R197 Diarrhea, unspecified: Secondary | ICD-10-CM

## 2014-11-17 DIAGNOSIS — C771 Secondary and unspecified malignant neoplasm of intrathoracic lymph nodes: Secondary | ICD-10-CM

## 2014-11-17 DIAGNOSIS — Z7952 Long term (current) use of systemic steroids: Secondary | ICD-10-CM

## 2014-11-17 DIAGNOSIS — F17210 Nicotine dependence, cigarettes, uncomplicated: Secondary | ICD-10-CM

## 2014-11-17 DIAGNOSIS — Z5111 Encounter for antineoplastic chemotherapy: Secondary | ICD-10-CM | POA: Diagnosis not present

## 2014-11-17 DIAGNOSIS — C787 Secondary malignant neoplasm of liver and intrahepatic bile duct: Secondary | ICD-10-CM

## 2014-11-17 DIAGNOSIS — C3412 Malignant neoplasm of upper lobe, left bronchus or lung: Secondary | ICD-10-CM

## 2014-11-17 DIAGNOSIS — C3411 Malignant neoplasm of upper lobe, right bronchus or lung: Secondary | ICD-10-CM

## 2014-11-17 LAB — CBC WITH DIFFERENTIAL/PLATELET
BASOS PCT: 1 %
Basophils Absolute: 0 10*3/uL (ref 0–0.1)
EOS PCT: 1 %
Eosinophils Absolute: 0 10*3/uL (ref 0–0.7)
HEMATOCRIT: 38.1 % (ref 35.0–47.0)
Hemoglobin: 12.9 g/dL (ref 12.0–16.0)
Lymphocytes Relative: 13 %
Lymphs Abs: 0.5 10*3/uL — ABNORMAL LOW (ref 1.0–3.6)
MCH: 31.3 pg (ref 26.0–34.0)
MCHC: 33.8 g/dL (ref 32.0–36.0)
MCV: 92.5 fL (ref 80.0–100.0)
MONO ABS: 0.2 10*3/uL (ref 0.2–0.9)
Monocytes Relative: 4 %
Neutro Abs: 3.4 10*3/uL (ref 1.4–6.5)
Neutrophils Relative %: 81 %
Platelets: 348 10*3/uL (ref 150–440)
RBC: 4.12 MIL/uL (ref 3.80–5.20)
RDW: 15.1 % — AB (ref 11.5–14.5)
WBC: 4.1 10*3/uL (ref 3.6–11.0)

## 2014-11-17 LAB — BASIC METABOLIC PANEL
Anion gap: 6 (ref 5–15)
BUN: 10 mg/dL (ref 6–20)
CO2: 26 mmol/L (ref 22–32)
Calcium: 9 mg/dL (ref 8.9–10.3)
Chloride: 104 mmol/L (ref 101–111)
Creatinine, Ser: 0.96 mg/dL (ref 0.44–1.00)
GFR calc non Af Amer: 56 mL/min — ABNORMAL LOW (ref >60)
GLUCOSE: 99 mg/dL (ref 65–99)
Potassium: 3.9 mmol/L (ref 3.5–5.1)
SODIUM: 136 mmol/L (ref 135–145)

## 2014-11-17 LAB — MAGNESIUM: Magnesium: 1.8 mg/dL (ref 1.7–2.4)

## 2014-11-17 MED ORDER — SODIUM CHLORIDE 0.9 % IJ SOLN
10.0000 mL | INTRAMUSCULAR | Status: DC | PRN
  Administered 2014-11-17: 10 mL via INTRAVENOUS
  Filled 2014-11-17: qty 10

## 2014-11-17 MED ORDER — PREDNISONE 20 MG PO TABS: 10.0000 mg | ORAL_TABLET | ORAL | Status: DC

## 2014-11-17 MED ORDER — HEPARIN SOD (PORK) LOCK FLUSH 100 UNIT/ML IV SOLN
500.0000 [IU] | Freq: Once | INTRAVENOUS | Status: AC
  Administered 2014-11-17: 500 [IU] via INTRAVENOUS

## 2014-11-17 MED ORDER — SODIUM CHLORIDE 0.9 % IV SOLN
1000.0000 mg/m2 | Freq: Once | INTRAVENOUS | Status: AC
  Administered 2014-11-17: 1786 mg via INTRAVENOUS
  Filled 2014-11-17: qty 46.97

## 2014-11-17 MED ORDER — PALONOSETRON HCL INJECTION 0.25 MG/5ML
0.2500 mg | Freq: Once | INTRAVENOUS | Status: AC
  Administered 2014-11-17: 0.25 mg via INTRAVENOUS
  Filled 2014-11-17: qty 5

## 2014-11-17 MED ORDER — HEPARIN SOD (PORK) LOCK FLUSH 100 UNIT/ML IV SOLN
500.0000 [IU] | Freq: Once | INTRAVENOUS | Status: DC | PRN
  Filled 2014-11-17: qty 5

## 2014-11-17 MED ORDER — SODIUM CHLORIDE 0.9 % IV SOLN
Freq: Once | INTRAVENOUS | Status: AC
  Administered 2014-11-17: 10:00:00 via INTRAVENOUS
  Filled 2014-11-17: qty 1000

## 2014-11-17 MED ORDER — SODIUM CHLORIDE 0.9 % IV SOLN
Freq: Once | INTRAVENOUS | Status: AC
  Administered 2014-11-17: 11:00:00 via INTRAVENOUS
  Filled 2014-11-17: qty 5

## 2014-11-17 MED ORDER — SODIUM CHLORIDE 0.9 % IV SOLN
390.0000 mg | Freq: Once | INTRAVENOUS | Status: AC
  Administered 2014-11-17: 390 mg via INTRAVENOUS
  Filled 2014-11-17: qty 39

## 2014-11-17 MED ORDER — SODIUM CHLORIDE 0.9 % IV SOLN
Freq: Once | INTRAVENOUS | Status: AC
  Administered 2014-11-17: 10:00:00 via INTRAVENOUS
  Filled 2014-11-17: qty 4

## 2014-11-17 NOTE — Progress Notes (Signed)
[place] @ [facility] Telephone:(336) [phone]  Fax:(336) [place]: [birth year]  MR#: [id number]  TKZ#:[id number]  Patient Care Team: Adin Hector, MD as PCP - General (Internal Medicine)  CHIEF COMPLAINT:  Chief Complaint  Patient presents with  . Follow-up    Oncology History   Non-small cell carcinoma of lung T1 N2 M1stage IV diseasePET scan is consistent with one small lesion in the T11 as well as suspected lesion in liver (diagnoses was in July of 2015) right upper lobe tumor with metastases to the left liver and mediastinal lymph node  MRI of liver conforms metastases to the liver.  MRI of thoracic spine raises possibility of metastases to the spine however this is inconclusive study.  (August, 2015) 2.  Patient was started on carboplatinum and Taxol started on November 25, 2013. 6 cycle of chemotherapy with carboplatinum and Taxol.  Last treatment was March 16, 2014 7.because of progressing disease on CT scan patient has been started on NIVO   in  February of 2016 8.  Patient is off NIVO because of diarrhea (June, 2016) 7.  Will be started on carboplatinum and gemcitabine from July, 19th, 2016     Cancer, metastatic to liver   03/24/2014 Initial Diagnosis Cancer, metastatic to liver    Cancer of lung, upper lobe   03/24/2014 Initial Diagnosis Cancer of lung, upper lobe    Oncology Flowsheet 08/18/2014 10/27/2014 11/03/2014  Day, Cycle - Day 1, Cycle 1 Day 8, Cycle 1  CARBOplatin (PARAPLATIN) IV - 390 mg -  dexamethasone (DECADRON) IV - [ 12 mg ] -  fosaprepitant (EMEND) IV - [ 150 mg ] -  gemcitabine (GEMZAR) IV - 1,000 mg/m2 1,000 mg/m2  nivolumab (OPDIVO) IV 200 mg - -  ondansetron (ZOFRAN) IV - - [ 8 mg ]  palonosetron (ALOXI) IV - 0.25 mg -    INTERVAL HISTORY:  79 year old lady who developed diarrhea secondary to   [place]. Marland Kitchen Patient was off prednisone and had again loose stool 2 were 3 per day.  Here for further follow-up no  chills.  No fever.  No nausea.  No vomiting.. November 03, 2014 Patient is here for ongoing evaluation and day 8 chemotherapy with gemcitabine.  Patient is being seen because of previous history of diarrhea.  Diarrhea is resolved.  Patient is on prednisone 10 mg and hyoscyamine .  She is feeling better.  Appetite is improving.  No abdominal pain.  No cough.  No shortness of breath. November 16, 2014 Patient is here for second cycle of chemotherapy with recurrent carcinoma of lung Patient is tolerating treatment very well.  There was thrombocytopenia last week but patient has recovered.  No chills.  No fever.  Diarrhea is improved.  We will decrease prednisone to 10 mg every other day  REVIEW OF SYSTEMS:   GENERAL:  Feels good.  Active.  No fevers, sweats or weight loss. PERFORMANCE STATUS (ECOG):  01 HEENT:  No visual changes, runny nose, sore throat, mouth sores or tenderness. Lungs: No shortness of breath or cough.  No hemoptysis. Cardiac:  No chest pain, palpitations, orthopnea, or PND. GI: Diarrhea is improving Abdominal discomfort.  No blood or mucus in the stool.  Watery stool 2 or 3 a day.  No chills.  No fever. GU:  No urgency, frequency, dysuria, or hematuria. Musculoskeletal:  No back pain.  No joint pain.  No muscle tenderness. Extremities:  No pain or swelling. Skin:  No rashes or skin changes. Neuro:  No headache, numbness or weakness, balance or coordination issues. Endocrine:  No diabetes, thyroid issues, hot flashes or night sweats. Psych:  No mood changes, depression or anxiety. Pain:  No focal pain. Review of systems:  All other systems reviewed and found to be negative.  As per HPI. Otherwise, a complete review of systems is negatve.  PAST MEDICAL HISTORY: Past Medical History  Diagnosis Date  . Lung cancer non small cell    PAST SURGICAL HISTORY: No significant past surgical history other than mentioned below FAMILY HISTORY There is no significant family history of  breast cancer, ovarian cancer, colon cancer Smoking History 0.5(1)60 years(1)  PFSH: Comments: family history of colon cancer cancer of uterus and history of hypertension and heart disease  Social History: positive tobacco  Smoker: Smoking cessation counseling performed  Smoker- Packs Per Day: 0.5  Smoker- How Many Years: 39-  Additional Past Medical and Surgical History: As mentioned above         ADVANCED DIRECTIVES: Patient does not have any advanced healthcare directive. Information has been given.   HEALTH MAINTENANCE: Social History  Substance Use Topics  . Smoking status: Current Every Day Smoker -- 0.50 packs/day for 60 years  . Smokeless tobacco: None  . Alcohol Use: None       Allergies  Allergen Reactions  . Codeine Other (See Comments) and Nausea Only    GI Distress  . Hydrochlorothiazide Other (See Comments)    hypercalcemia  . Prednisone Swelling      OBJECTIVE:  Filed Vitals:   11/17/14 0928  BP: 145/88  Pulse: 87  Temp: 96.4 F (35.8 C)     Body mass index is 29.92 kg/(m^2).    ECOG FS:1 - Symptomatic but completely ambulatory  PHYSICAL EXAM: Goal status: Performance status is good.  Patient has not lost significant weight HEENT: No evidence of stomatitis. Sclera and conjunctivae :: No jaundice.   pale looking. Lungs: Air  entry equal on both sides.  No rhonchi.  No rales.  Cardiac: Heart sounds are normal.  No pericardial rub.  No murmur. Lymphatic system: Cervical, axillary, inguinal, lymph nodes not palpable GI: Abdomen is soft.  No ascites.  Liver spleen not palpable.  No tenderness.  Bowel sounds are within normal limit Lower extremity: No edema Neurological system: Higher functions, cranial nerves intact no evidence of peripheral neuropathy. Skin: No rash.  No ecchymosis.. NO clinical signs of dehydration LAB RESULTS:  Infusion on 11/17/2014  Component Date Value Ref Range Status  . WBC 11/17/2014 4.1  3.6 - 11.0 K/uL Final    A-LINE DRAW  . RBC 11/17/2014 4.12  3.80 - 5.20 MIL/uL Final  . Hemoglobin 11/17/2014 12.9  12.0 - 16.0 g/dL Final  . HCT 11/17/2014 38.1  35.0 - 47.0 % Final  . MCV 11/17/2014 92.5  80.0 - 100.0 fL Final  . MCH 11/17/2014 31.3  26.0 - 34.0 pg Final  . MCHC 11/17/2014 33.8  32.0 - 36.0 g/dL Final  . RDW 11/17/2014 15.1* 11.5 - 14.5 % Final  . Platelets 11/17/2014 348  150 - 440 K/uL Final  . Neutrophils Relative % 11/17/2014 81   Final  . Neutro Abs 11/17/2014 3.4  1.4 - 6.5 K/uL Final  . Lymphocytes Relative 11/17/2014 13   Final  . Lymphs Abs 11/17/2014 0.5* 1.0 - 3.6 K/uL Final  . Monocytes Relative 11/17/2014 4   Final  . Monocytes Absolute 11/17/2014 0.2  0.2 - 0.9  K/uL Final  . Eosinophils Relative 11/17/2014 1   Final  . Eosinophils Absolute 11/17/2014 0.0  0 - 0.7 K/uL Final  . Basophils Relative 11/17/2014 1   Final  . Basophils Absolute 11/17/2014 0.0  0 - 0.1 K/uL Final  . Sodium 11/17/2014 136  135 - 145 mmol/L Final  . Potassium 11/17/2014 3.9  3.5 - 5.1 mmol/L Final  . Chloride 11/17/2014 104  101 - 111 mmol/L Final  . CO2 11/17/2014 26  22 - 32 mmol/L Final  . Glucose, Bld 11/17/2014 99  65 - 99 mg/dL Final  . BUN 11/17/2014 10  6 - 20 mg/dL Final  . Creatinine, Ser 11/17/2014 0.96  0.44 - 1.00 mg/dL Final  . Calcium 11/17/2014 9.0  8.9 - 10.3 mg/dL Final  . GFR calc non Af Amer 11/17/2014 56* >60 mL/min Final  . GFR calc Af Amer 11/17/2014 >60  >60 mL/min Final   Comment: (NOTE) The eGFR has been calculated using the CKD EPI equation. This calculation has not been validated in all clinical situations. eGFR's persistently <60 mL/min signify possible Chronic Kidney Disease.   . Anion gap 11/17/2014 6  5 - 15 Final  . Magnesium 11/17/2014 1.8  1.7 - 2.4 mg/dL Final      STUDIES: All lab data has been reviewed. ASSESSMENT: Carcinoma of lung, non-small cell type.  Stage IV patient was on NIVOLULAMAB We will continue second cycle of chemotherapy.  After 3 cycles  patient will get repeat CT scan or a PET scan for restaging.  Diarrhea is improved.  Patient was advised to take 10 mg off all prednisone every other day any diarrhea.  Recurs that go back on 20 mg every other day.    Cancer of lung, upper lobe   Staging form: Lung, AJCC 7th Edition     Clinical: Stage IV (T1, N2, M1b) - Marni Griffon, MD   11/17/2014 9:52 AM

## 2014-11-17 NOTE — Progress Notes (Signed)
Patient does not have living will.  Current every day smoker.

## 2014-11-18 ENCOUNTER — Telehealth: Payer: Self-pay | Admitting: *Deleted

## 2014-11-18 MED ORDER — HYOSCYAMINE SULFATE ER 0.375 MG PO TB12: 0.3750 mg | ORAL_TABLET | Freq: Two times a day (BID) | ORAL | Status: AC

## 2014-11-18 NOTE — Telephone Encounter (Signed)
Watery Diarrhea came back after tx yesterday and she needs refill on her Hyoscymine 0.375 mg bid 30 - 60 mins before meals. Her hands also started itching and got very red so she took a benadryl which "took care of it". States she is going to take full dose of prednisone due to the problems she had and not the half dose as discussed at visit

## 2014-11-18 NOTE — Telephone Encounter (Signed)
Informed of rx being sent and that he agrees with full dose of prednisone

## 2014-11-23 ENCOUNTER — Telehealth: Payer: Self-pay | Admitting: *Deleted

## 2014-11-23 NOTE — Telephone Encounter (Signed)
Per Dr Oliva Bustard, stop potassium and have her eat bananas and drink OJ . Pharmacy notified and will inform patient of potassium changes

## 2014-11-23 NOTE — Telephone Encounter (Signed)
There is a drug interaction between Hycosamine and potassium and they need to hear back form [place] regarding this

## 2014-11-24 ENCOUNTER — Inpatient Hospital Stay: Payer: Medicare Other

## 2014-11-24 VITALS — BP 131/86 | HR 87 | Temp 97.7°F

## 2014-11-24 DIAGNOSIS — C787 Secondary malignant neoplasm of liver and intrahepatic bile duct: Secondary | ICD-10-CM

## 2014-11-24 DIAGNOSIS — Z5111 Encounter for antineoplastic chemotherapy: Secondary | ICD-10-CM | POA: Diagnosis not present

## 2014-11-24 LAB — CBC WITH DIFFERENTIAL/PLATELET
BASOS ABS: 0 10*3/uL (ref 0–0.1)
BASOS PCT: 1 %
EOS PCT: 1 %
Eosinophils Absolute: 0 10*3/uL (ref 0–0.7)
HCT: 36.1 % (ref 35.0–47.0)
Hemoglobin: 12.3 g/dL (ref 12.0–16.0)
Lymphocytes Relative: 55 %
Lymphs Abs: 2.6 10*3/uL (ref 1.0–3.6)
MCH: 31.6 pg (ref 26.0–34.0)
MCHC: 34.2 g/dL (ref 32.0–36.0)
MCV: 92.5 fL (ref 80.0–100.0)
MONO ABS: 0.2 10*3/uL (ref 0.2–0.9)
MONOS PCT: 4 %
NEUTROS ABS: 1.8 10*3/uL (ref 1.4–6.5)
Neutrophils Relative %: 39 %
PLATELETS: 389 10*3/uL (ref 150–440)
RBC: 3.9 MIL/uL (ref 3.80–5.20)
RDW: 15.3 % — AB (ref 11.5–14.5)
WBC: 4.7 10*3/uL (ref 3.6–11.0)

## 2014-11-24 MED ORDER — HEPARIN SOD (PORK) LOCK FLUSH 100 UNIT/ML IV SOLN
500.0000 [IU] | Freq: Once | INTRAVENOUS | Status: AC | PRN
  Administered 2014-11-24: 500 [IU]
  Filled 2014-11-24: qty 5

## 2014-11-24 MED ORDER — SODIUM CHLORIDE 0.9 % IV SOLN
Freq: Once | INTRAVENOUS | Status: AC
Start: 2014-11-24 — End: 2014-11-24
  Administered 2014-11-24: 15:00:00 via INTRAVENOUS
  Filled 2014-11-24: qty 1000

## 2014-11-24 MED ORDER — SODIUM CHLORIDE 0.9 % IJ SOLN
10.0000 mL | INTRAMUSCULAR | Status: DC | PRN
  Administered 2014-11-24: 10 mL
  Filled 2014-11-24: qty 10

## 2014-11-24 MED ORDER — SODIUM CHLORIDE 0.9 % IV SOLN
Freq: Once | INTRAVENOUS | Status: AC
  Administered 2014-11-24: 15:00:00 via INTRAVENOUS
  Filled 2014-11-24: qty 4

## 2014-11-24 MED ORDER — SODIUM CHLORIDE 0.9 % IV SOLN
1000.0000 mg/m2 | Freq: Once | INTRAVENOUS | Status: AC
  Administered 2014-11-24: 1786 mg via INTRAVENOUS
  Filled 2014-11-24: qty 41.71

## 2014-12-01 ENCOUNTER — Inpatient Hospital Stay: Payer: Medicare Other

## 2014-12-01 DIAGNOSIS — C787 Secondary malignant neoplasm of liver and intrahepatic bile duct: Secondary | ICD-10-CM

## 2014-12-01 DIAGNOSIS — Z5111 Encounter for antineoplastic chemotherapy: Secondary | ICD-10-CM | POA: Diagnosis not present

## 2014-12-01 LAB — CBC WITH DIFFERENTIAL/PLATELET
Basophils Absolute: 0 10*3/uL (ref 0–0.1)
Basophils Relative: 1 %
EOS PCT: 0 %
Eosinophils Absolute: 0 10*3/uL (ref 0–0.7)
HEMATOCRIT: 35.8 % (ref 35.0–47.0)
Hemoglobin: 12.1 g/dL (ref 12.0–16.0)
LYMPHS ABS: 0.8 10*3/uL — AB (ref 1.0–3.6)
LYMPHS PCT: 26 %
MCH: 31.3 pg (ref 26.0–34.0)
MCHC: 33.7 g/dL (ref 32.0–36.0)
MCV: 92.9 fL (ref 80.0–100.0)
MONO ABS: 0.3 10*3/uL (ref 0.2–0.9)
Monocytes Relative: 8 %
Neutro Abs: 2 10*3/uL (ref 1.4–6.5)
Neutrophils Relative %: 65 %
PLATELETS: 93 10*3/uL — AB (ref 150–440)
RBC: 3.86 MIL/uL (ref 3.80–5.20)
RDW: 15.3 % — ABNORMAL HIGH (ref 11.5–14.5)
WBC: 3.1 10*3/uL — AB (ref 3.6–11.0)

## 2014-12-02 ENCOUNTER — Telehealth: Payer: Self-pay | Admitting: *Deleted

## 2014-12-02 NOTE — Telephone Encounter (Signed)
Taper to every other day times 1 week then stop. Patient informed adn repeated back to me

## 2014-12-02 NOTE — Telephone Encounter (Signed)
Called reporting thast her stomach feels better and wants to know if she can come off her Prednisone. Her current dose is 1/2 tab 20 mg prednisone daily

## 2014-12-08 ENCOUNTER — Inpatient Hospital Stay: Payer: Medicare Other

## 2014-12-08 ENCOUNTER — Encounter: Payer: Self-pay | Admitting: Oncology

## 2014-12-08 ENCOUNTER — Inpatient Hospital Stay (HOSPITAL_BASED_OUTPATIENT_CLINIC_OR_DEPARTMENT_OTHER): Payer: Medicare Other | Admitting: Oncology

## 2014-12-08 VITALS — BP 138/77 | HR 93 | Temp 96.5°F | Wt 161.0 lb

## 2014-12-08 DIAGNOSIS — C787 Secondary malignant neoplasm of liver and intrahepatic bile duct: Secondary | ICD-10-CM

## 2014-12-08 DIAGNOSIS — C3411 Malignant neoplasm of upper lobe, right bronchus or lung: Secondary | ICD-10-CM

## 2014-12-08 DIAGNOSIS — C3412 Malignant neoplasm of upper lobe, left bronchus or lung: Secondary | ICD-10-CM

## 2014-12-08 DIAGNOSIS — Z5111 Encounter for antineoplastic chemotherapy: Secondary | ICD-10-CM | POA: Diagnosis not present

## 2014-12-08 DIAGNOSIS — C771 Secondary and unspecified malignant neoplasm of intrathoracic lymph nodes: Secondary | ICD-10-CM | POA: Diagnosis not present

## 2014-12-08 DIAGNOSIS — R197 Diarrhea, unspecified: Secondary | ICD-10-CM

## 2014-12-08 DIAGNOSIS — F17210 Nicotine dependence, cigarettes, uncomplicated: Secondary | ICD-10-CM

## 2014-12-08 DIAGNOSIS — M1990 Unspecified osteoarthritis, unspecified site: Secondary | ICD-10-CM

## 2014-12-08 DIAGNOSIS — E785 Hyperlipidemia, unspecified: Secondary | ICD-10-CM

## 2014-12-08 DIAGNOSIS — Z800 Family history of malignant neoplasm of digestive organs: Secondary | ICD-10-CM

## 2014-12-08 DIAGNOSIS — C801 Malignant (primary) neoplasm, unspecified: Secondary | ICD-10-CM

## 2014-12-08 LAB — COMPREHENSIVE METABOLIC PANEL
ALBUMIN: 4 g/dL (ref 3.5–5.0)
ALK PHOS: 56 U/L (ref 38–126)
ALT: 21 U/L (ref 14–54)
AST: 36 U/L (ref 15–41)
Anion gap: 7 (ref 5–15)
BILIRUBIN TOTAL: 0.4 mg/dL (ref 0.3–1.2)
BUN: 9 mg/dL (ref 6–20)
CALCIUM: 8.8 mg/dL — AB (ref 8.9–10.3)
CO2: 27 mmol/L (ref 22–32)
CREATININE: 0.84 mg/dL (ref 0.44–1.00)
Chloride: 103 mmol/L (ref 101–111)
GFR calc Af Amer: >60 mL/min (ref >60)
GLUCOSE: 97 mg/dL (ref 65–99)
POTASSIUM: 3.6 mmol/L (ref 3.5–5.1)
Sodium: 137 mmol/L (ref 135–145)
TOTAL PROTEIN: 6.6 g/dL (ref 6.5–8.1)

## 2014-12-08 LAB — CBC WITH DIFFERENTIAL/PLATELET
BASOS ABS: 0 10*3/uL (ref 0–0.1)
BASOS PCT: 1 %
Eosinophils Absolute: 0.1 10*3/uL (ref 0–0.7)
Eosinophils Relative: 1 %
HEMATOCRIT: 35.1 % (ref 35.0–47.0)
HEMOGLOBIN: 11.9 g/dL — AB (ref 12.0–16.0)
LYMPHS PCT: 21 %
Lymphs Abs: 1.2 10*3/uL (ref 1.0–3.6)
MCH: 31.9 pg (ref 26.0–34.0)
MCHC: 33.9 g/dL (ref 32.0–36.0)
MCV: 94 fL (ref 80.0–100.0)
MONO ABS: 0.8 10*3/uL (ref 0.2–0.9)
MONOS PCT: 15 %
NEUTROS ABS: 3.5 10*3/uL (ref 1.4–6.5)
NEUTROS PCT: 62 %
Platelets: 229 10*3/uL (ref 150–440)
RBC: 3.73 MIL/uL — ABNORMAL LOW (ref 3.80–5.20)
RDW: 17.2 % — AB (ref 11.5–14.5)
WBC: 5.6 10*3/uL (ref 3.6–11.0)

## 2014-12-08 LAB — MAGNESIUM: MAGNESIUM: 1.6 mg/dL — AB (ref 1.7–2.4)

## 2014-12-08 MED ORDER — SODIUM CHLORIDE 0.9 % IV SOLN
Freq: Once | INTRAVENOUS | Status: AC
  Administered 2014-12-08: 11:00:00 via INTRAVENOUS
  Filled 2014-12-08: qty 5

## 2014-12-08 MED ORDER — HEPARIN SOD (PORK) LOCK FLUSH 100 UNIT/ML IV SOLN
500.0000 [IU] | Freq: Once | INTRAVENOUS | Status: AC
  Administered 2014-12-08: 500 [IU] via INTRAVENOUS

## 2014-12-08 MED ORDER — FAMOTIDINE IN NACL 20-0.9 MG/50ML-% IV SOLN
20.0000 mg | Freq: Once | INTRAVENOUS | Status: AC
  Administered 2014-12-08: 20 mg via INTRAVENOUS
  Filled 2014-12-08: qty 50

## 2014-12-08 MED ORDER — SODIUM CHLORIDE 0.9 % IV SOLN
1000.0000 mg/m2 | Freq: Once | INTRAVENOUS | Status: AC
  Administered 2014-12-08: 1786 mg via INTRAVENOUS
  Filled 2014-12-08: qty 47

## 2014-12-08 MED ORDER — HYDROCORTISONE NA SUCCINATE PF 100 MG IJ SOLR
100.0000 mg | Freq: Once | INTRAMUSCULAR | Status: AC
  Administered 2014-12-08: 100 mg via INTRAVENOUS

## 2014-12-08 MED ORDER — SODIUM CHLORIDE 0.9 % IV SOLN
390.0000 mg | Freq: Once | INTRAVENOUS | Status: AC
  Administered 2014-12-08: 390 mg via INTRAVENOUS
  Filled 2014-12-08: qty 39

## 2014-12-08 MED ORDER — LORATADINE 10 MG PO TABS
10.0000 mg | ORAL_TABLET | Freq: Once | ORAL | Status: AC
  Administered 2014-12-08: 10 mg via ORAL
  Filled 2014-12-08: qty 1

## 2014-12-08 MED ORDER — DIPHENHYDRAMINE HCL 50 MG/ML IJ SOLN
INTRAMUSCULAR | Status: AC
  Filled 2014-12-08: qty 1

## 2014-12-08 MED ORDER — SODIUM CHLORIDE 0.9 % IV SOLN
Freq: Once | INTRAVENOUS | Status: AC
  Administered 2014-12-08: 10:00:00 via INTRAVENOUS
  Filled 2014-12-08: qty 1000

## 2014-12-08 MED ORDER — SODIUM CHLORIDE 0.9 % IJ SOLN
10.0000 mL | INTRAMUSCULAR | Status: DC | PRN
  Administered 2014-12-08: 10 mL
  Filled 2014-12-08: qty 10

## 2014-12-08 MED ORDER — HYDROCORTISONE NA SUCCINATE PF 100 MG IJ SOLR
INTRAMUSCULAR | Status: AC
  Filled 2014-12-08: qty 2

## 2014-12-08 MED ORDER — DIPHENHYDRAMINE HCL 50 MG/ML IJ SOLN
12.5000 mg | Freq: Once | INTRAMUSCULAR | Status: AC
  Administered 2014-12-08: 12.5 mg via INTRAVENOUS

## 2014-12-08 MED ORDER — DIPHENHYDRAMINE HCL 50 MG/ML IJ SOLN
50.0000 mg | Freq: Once | INTRAMUSCULAR | Status: AC
  Administered 2014-12-08: 50 mg via INTRAVENOUS
  Filled 2014-12-08: qty 1

## 2014-12-08 MED ORDER — PALONOSETRON HCL INJECTION 0.25 MG/5ML
0.2500 mg | Freq: Once | INTRAVENOUS | Status: AC
  Administered 2014-12-08: 0.25 mg via INTRAVENOUS
  Filled 2014-12-08: qty 5

## 2014-12-08 NOTE — Progress Notes (Signed)
Patient experienced swelling and itching in both hands after carboplatin infusion was completed.  Did not experience and difficulty swallowing or breathing.  Did not have any other symptoms.  Patient given benadryl and solu cortef and responded well, and reported that symptoms had improved

## 2014-12-08 NOTE — Progress Notes (Signed)
Patient does not have living will.  Current every day smoker.

## 2014-12-10 ENCOUNTER — Encounter: Payer: Self-pay | Admitting: Oncology

## 2014-12-10 NOTE — Progress Notes (Signed)
[place] @ [facility] Telephone:(336) [phone]  Fax:(336) [place]: 01/24/1936  MR#: [id number]  XNA#:[id number]  Patient Care Team: Adin Hector, MD as PCP - General (Internal Medicine)  CHIEF COMPLAINT:  Chief Complaint  Patient presents with  . Follow-up    Oncology History   Non-small cell carcinoma of lung T1 N2 M1stage IV diseasePET scan is consistent with one small lesion in the T11 as well as suspected lesion in liver (diagnoses was in July of 2015) right upper lobe tumor with metastases to the left liver and mediastinal lymph node  MRI of liver conforms metastases to the liver.  MRI of thoracic spine raises possibility of metastases to the spine however this is inconclusive study.  (August, 2015) 2.  Patient was started on carboplatinum and Taxol started on November 25, 2013. 6 cycle of chemotherapy with carboplatinum and Taxol.  Last treatment was March 16, 2014 7.because of progressing disease on CT scan patient has been started on NIVO   in  February of 2016 8.  Patient is off NIVO because of diarrhea (June, 2016) 7.  Will be started on carboplatinum and gemcitabine from July, 19th, 2016     Cancer, metastatic to liver   03/24/2014 Initial Diagnosis Cancer, metastatic to liver    Cancer of lung, upper lobe   03/24/2014 Initial Diagnosis Cancer of lung, upper lobe    Oncology Flowsheet 08/18/2014 10/27/2014 11/03/2014 11/17/2014 11/24/2014 12/08/2014  Day, Cycle - Day 1, Cycle 1 Day 8, Cycle 1 Day 1, Cycle 2 Day 8, Cycle 2 Day 1, Cycle 3  CARBOplatin (PARAPLATIN) IV - 390 mg - 390 mg - 390 mg  dexamethasone (DECADRON) IV - [ 12 mg ] - [ 12 mg ] - [ 12 mg ]  fosaprepitant (EMEND) IV - [ 150 mg ] - [ 150 mg ] - [ 150 mg ]  gemcitabine (GEMZAR) IV - 1,000 mg/m2 1,000 mg/m2 1,000 mg/m2 1,000 mg/m2 1,000 mg/m2  nivolumab (OPDIVO) IV 200 mg - - - - -  ondansetron (ZOFRAN) IV - - [ 8 mg ] [ 8 mg ] [ 8 mg ] -  palonosetron (ALOXI) IV - 0.25 mg -  0.25 mg - 0.25 mg    INTERVAL HISTORY:  79 year old lady who developed diarrhea secondary to   [place]. Marland Kitchen Patient was off prednisone and had again loose stool 2 were 3 per day.  Here for further follow-up no chills.  No fever.  No nausea.  No vomiting.. November 03, 2014 Patient is here for ongoing evaluation and day 8 chemotherapy with gemcitabine.  Patient is being seen because of previous history of diarrhea.  Diarrhea is resolved.  Patient is on prednisone 10 mg and hyoscyamine .  She is feeling better.  Appetite is improving.  No abdominal pain.  No cough.  No shortness of breath. November 16, 2014 Patient is here for second cycle of chemotherapy with recurrent carcinoma of lung Patient is tolerating treatment very well.  There was thrombocytopenia last week but patient has recovered.  No chills.  No fever.  Diarrhea is improved.  We will decrease prednisone to 10 mg every other day November 12 2014 Patient is here for ongoing evaluation and consideration of continuation of chemotherapy.  Diarrhea has improved.  Patient is off prednisone.  No chills fever no abdominal pain appetite is improving gaining weight  REVIEW OF SYSTEMS:   GENERAL:  Feels good.  Active.  No fevers,  sweats or weight loss. PERFORMANCE STATUS (ECOG):  01 HEENT:  No visual changes, runny nose, sore throat, mouth sores or tenderness. Lungs: No shortness of breath or cough.  No hemoptysis. Cardiac:  No chest pain, palpitations, orthopnea, or PND. GI: Diarrhea is improving Abdominal discomfort.  No blood or mucus in the stool.  Watery stool 2 or 3 a day.  No chills.  No fever. GU:  No urgency, frequency, dysuria, or hematuria. Musculoskeletal:  No back pain.  No joint pain.  No muscle tenderness. Extremities:  No pain or swelling. Skin:  No rashes or skin changes. Neuro:  No headache, numbness or weakness, balance or coordination issues. Endocrine:  No diabetes, thyroid issues, hot flashes or night sweats. Psych:  No  mood changes, depression or anxiety. Pain:  No focal pain. Review of systems:  All other systems reviewed and found to be negative.  As per HPI. Otherwise, a complete review of systems is negatve.  PAST MEDICAL HISTORY: Past Medical History  Diagnosis Date  . Lung cancer non small cell    PAST SURGICAL HISTORY: No significant past surgical history other than mentioned below FAMILY HISTORY There is no significant family history of breast cancer, ovarian cancer, colon cancer Smoking History 0.5(1)60 years(1)  PFSH: Comments: family history of colon cancer cancer of uterus and history of hypertension and heart disease  Social History: positive tobacco  Smoker: Smoking cessation counseling performed  Smoker- Packs Per Day: 0.5  Smoker- How Many Years: 46-  Additional Past Medical and Surgical History: As mentioned above         ADVANCED DIRECTIVES: Patient does not have any advanced healthcare directive. Information has been given.   HEALTH MAINTENANCE: Social History  Substance Use Topics  . Smoking status: Current Every Day Smoker -- 0.50 packs/day for 60 years  . Smokeless tobacco: None  . Alcohol Use: None       Allergies  Allergen Reactions  . Codeine Other (See Comments) and Nausea Only    GI Distress  . Hydrochlorothiazide Other (See Comments)    hypercalcemia  . Prednisone Swelling      OBJECTIVE:  Filed Vitals:   12/08/14 0918  BP: 138/77  Pulse: 93  Temp: 96.5 F (35.8 C)     Body mass index is 30.44 kg/(m^2).    ECOG FS:1 - Symptomatic but completely ambulatory  PHYSICAL EXAM: Goal status: Performance status is good.  Patient has not lost significant weight HEENT: No evidence of stomatitis. Sclera and conjunctivae :: No jaundice.   pale looking. Lungs: Air  entry equal on both sides.  No rhonchi.  No rales.  Cardiac: Heart sounds are normal.  No pericardial rub.  No murmur. Lymphatic system: Cervical, axillary, inguinal, lymph nodes not  palpable GI: Abdomen is soft.  No ascites.  Liver spleen not palpable.  No tenderness.  Bowel sounds are within normal limit Lower extremity: No edema Neurological system: Higher functions, cranial nerves intact no evidence of peripheral neuropathy. Skin: No rash.  No ecchymosis.. NO clinical signs of dehydration LAB RESULTS:  Infusion on 12/08/2014  Component Date Value Ref Range Status  . WBC 12/08/2014 5.6  3.6 - 11.0 K/uL Final   A-LINE DRAW  . RBC 12/08/2014 3.73* 3.80 - 5.20 MIL/uL Final  . Hemoglobin 12/08/2014 11.9* 12.0 - 16.0 g/dL Final  . HCT 12/08/2014 35.1  35.0 - 47.0 % Final  . MCV 12/08/2014 94.0  80.0 - 100.0 fL Final  . MCH 12/08/2014 31.9  26.0 -  34.0 pg Final  . MCHC 12/08/2014 33.9  32.0 - 36.0 g/dL Final  . RDW 12/08/2014 17.2* 11.5 - 14.5 % Final  . Platelets 12/08/2014 229  150 - 440 K/uL Final  . Neutrophils Relative % 12/08/2014 62   Final  . Neutro Abs 12/08/2014 3.5  1.4 - 6.5 K/uL Final  . Lymphocytes Relative 12/08/2014 21   Final  . Lymphs Abs 12/08/2014 1.2  1.0 - 3.6 K/uL Final  . Monocytes Relative 12/08/2014 15   Final  . Monocytes Absolute 12/08/2014 0.8  0.2 - 0.9 K/uL Final  . Eosinophils Relative 12/08/2014 1   Final  . Eosinophils Absolute 12/08/2014 0.1  0 - 0.7 K/uL Final  . Basophils Relative 12/08/2014 1   Final  . Basophils Absolute 12/08/2014 0.0  0 - 0.1 K/uL Final  . Sodium 12/08/2014 137  135 - 145 mmol/L Final  . Potassium 12/08/2014 3.6  3.5 - 5.1 mmol/L Final  . Chloride 12/08/2014 103  101 - 111 mmol/L Final  . CO2 12/08/2014 27  22 - 32 mmol/L Final  . Glucose, Bld 12/08/2014 97  65 - 99 mg/dL Final  . BUN 12/08/2014 9  6 - 20 mg/dL Final  . Creatinine, Ser 12/08/2014 0.84  0.44 - 1.00 mg/dL Final  . Calcium 12/08/2014 8.8* 8.9 - 10.3 mg/dL Final  . Total Protein 12/08/2014 6.6  6.5 - 8.1 g/dL Final  . Albumin 12/08/2014 4.0  3.5 - 5.0 g/dL Final  . AST 12/08/2014 36  15 - 41 U/L Final  . ALT 12/08/2014 21  14 - 54 U/L  Final  . Alkaline Phosphatase 12/08/2014 56  38 - 126 U/L Final  . Total Bilirubin 12/08/2014 0.4  0.3 - 1.2 mg/dL Final  . GFR calc non Af Amer 12/08/2014 >60  >60 mL/min Final  . GFR calc Af Amer 12/08/2014 >60  >60 mL/min Final   Comment: (NOTE) The eGFR has been calculated using the CKD EPI equation. This calculation has not been validated in all clinical situations. eGFR's persistently <60 mL/min signify possible Chronic Kidney Disease.   . Anion gap 12/08/2014 7  5 - 15 Final  . Magnesium 12/08/2014 1.6* 1.7 - 2.4 mg/dL Final      STUDIES: All lab data has been reviewed. ASSESSMENT: Carcinoma of lung, non-small cell type.  Stage IV patient was on NIVOLULAMAB NIVOLULAMAB was discontinued because of persistent diarrhea Patient has been started on carboplatinum and gemcitabine Diarrhea is improved.  Patient is off prednisone After this chemotherapy cycle patient will get a PET scan done for reevaluation and restaging PET scan prior to next cycle of chemotherapy continue day 8 gemcitabine 3. PET scan on 9/19 or 9/20 for lung cancer restaging Date 8 chemotherapy with gemcitabine has been ordered    Cancer of lung, upper lobe   Staging form: Lung, AJCC 7th Edition     Clinical: Stage IV (T1, N2, M1b) - Jill Griffon, MD   12/10/2014 8:56 AM

## 2014-12-15 ENCOUNTER — Inpatient Hospital Stay: Payer: Medicare Other

## 2014-12-15 ENCOUNTER — Inpatient Hospital Stay: Payer: Medicare Other | Attending: Oncology

## 2014-12-15 DIAGNOSIS — I700 Atherosclerosis of aorta: Secondary | ICD-10-CM | POA: Insufficient documentation

## 2014-12-15 DIAGNOSIS — D696 Thrombocytopenia, unspecified: Secondary | ICD-10-CM | POA: Diagnosis not present

## 2014-12-15 DIAGNOSIS — F17210 Nicotine dependence, cigarettes, uncomplicated: Secondary | ICD-10-CM | POA: Insufficient documentation

## 2014-12-15 DIAGNOSIS — R000 Tachycardia, unspecified: Secondary | ICD-10-CM | POA: Insufficient documentation

## 2014-12-15 DIAGNOSIS — M25522 Pain in left elbow: Secondary | ICD-10-CM | POA: Insufficient documentation

## 2014-12-15 DIAGNOSIS — C787 Secondary malignant neoplasm of liver and intrahepatic bile duct: Secondary | ICD-10-CM

## 2014-12-15 DIAGNOSIS — M436 Torticollis: Secondary | ICD-10-CM | POA: Diagnosis not present

## 2014-12-15 DIAGNOSIS — I2510 Atherosclerotic heart disease of native coronary artery without angina pectoris: Secondary | ICD-10-CM | POA: Insufficient documentation

## 2014-12-15 DIAGNOSIS — K8020 Calculus of gallbladder without cholecystitis without obstruction: Secondary | ICD-10-CM | POA: Insufficient documentation

## 2014-12-15 DIAGNOSIS — C771 Secondary and unspecified malignant neoplasm of intrathoracic lymph nodes: Secondary | ICD-10-CM | POA: Diagnosis not present

## 2014-12-15 DIAGNOSIS — R062 Wheezing: Secondary | ICD-10-CM | POA: Insufficient documentation

## 2014-12-15 DIAGNOSIS — Z79899 Other long term (current) drug therapy: Secondary | ICD-10-CM | POA: Insufficient documentation

## 2014-12-15 DIAGNOSIS — R232 Flushing: Secondary | ICD-10-CM | POA: Diagnosis not present

## 2014-12-15 DIAGNOSIS — Z5111 Encounter for antineoplastic chemotherapy: Secondary | ICD-10-CM | POA: Diagnosis not present

## 2014-12-15 DIAGNOSIS — C3411 Malignant neoplasm of upper lobe, right bronchus or lung: Secondary | ICD-10-CM | POA: Insufficient documentation

## 2014-12-15 LAB — CBC WITH DIFFERENTIAL/PLATELET
BASOS ABS: 0.1 10*3/uL (ref 0–0.1)
Basophils Relative: 2 %
EOS PCT: 2 %
Eosinophils Absolute: 0.1 10*3/uL (ref 0–0.7)
HEMATOCRIT: 31.9 % — AB (ref 35.0–47.0)
Hemoglobin: 11 g/dL — ABNORMAL LOW (ref 12.0–16.0)
LYMPHS ABS: 1.9 10*3/uL (ref 1.0–3.6)
LYMPHS PCT: 57 %
MCH: 32.6 pg (ref 26.0–34.0)
MCHC: 34.6 g/dL (ref 32.0–36.0)
MCV: 94.1 fL (ref 80.0–100.0)
MONO ABS: 0.3 10*3/uL (ref 0.2–0.9)
Monocytes Relative: 8 %
NEUTROS ABS: 1 10*3/uL — AB (ref 1.4–6.5)
Neutrophils Relative %: 31 %
PLATELETS: 317 10*3/uL (ref 150–440)
RBC: 3.39 MIL/uL — AB (ref 3.80–5.20)
RDW: 18.8 % — ABNORMAL HIGH (ref 11.5–14.5)
WBC: 3.3 10*3/uL — ABNORMAL LOW (ref 3.6–11.0)

## 2014-12-15 MED ORDER — HEPARIN SOD (PORK) LOCK FLUSH 100 UNIT/ML IV SOLN: 500.0000 [IU] | Freq: Once | INTRAVENOUS | Status: DC

## 2014-12-15 MED ORDER — HEPARIN SOD (PORK) LOCK FLUSH 100 UNIT/ML IV SOLN
500.0000 [IU] | Freq: Once | INTRAVENOUS | Status: AC
  Administered 2014-12-15: 500 [IU] via INTRAVENOUS
  Filled 2014-12-15: qty 5

## 2014-12-15 MED ORDER — SODIUM CHLORIDE 0.9 % IJ SOLN
10.0000 mL | INTRAMUSCULAR | Status: DC | PRN
  Administered 2014-12-15: 10 mL via INTRAVENOUS
  Filled 2014-12-15: qty 10

## 2014-12-22 ENCOUNTER — Inpatient Hospital Stay: Payer: Medicare Other

## 2014-12-22 DIAGNOSIS — C787 Secondary malignant neoplasm of liver and intrahepatic bile duct: Secondary | ICD-10-CM

## 2014-12-22 DIAGNOSIS — Z5111 Encounter for antineoplastic chemotherapy: Secondary | ICD-10-CM | POA: Diagnosis not present

## 2014-12-22 LAB — CBC WITH DIFFERENTIAL/PLATELET
BASOS ABS: 0 10*3/uL (ref 0–0.1)
BASOS PCT: 1 %
Eosinophils Absolute: 0.1 10*3/uL (ref 0–0.7)
Eosinophils Relative: 2 %
HEMATOCRIT: 34.5 % — AB (ref 35.0–47.0)
Hemoglobin: 11.5 g/dL — ABNORMAL LOW (ref 12.0–16.0)
LYMPHS PCT: 19 %
Lymphs Abs: 1 10*3/uL (ref 1.0–3.6)
MCH: 32.9 pg (ref 26.0–34.0)
MCHC: 33.4 g/dL (ref 32.0–36.0)
MCV: 98.4 fL (ref 80.0–100.0)
Monocytes Absolute: 0.7 10*3/uL (ref 0.2–0.9)
Monocytes Relative: 13 %
NEUTROS ABS: 3.5 10*3/uL (ref 1.4–6.5)
NEUTROS PCT: 65 %
Platelets: 142 10*3/uL — ABNORMAL LOW (ref 150–440)
RBC: 3.51 MIL/uL — AB (ref 3.80–5.20)
RDW: 22.1 % — AB (ref 11.5–14.5)
WBC: 5.3 10*3/uL (ref 3.6–11.0)

## 2014-12-27 ENCOUNTER — Ambulatory Visit
Admission: RE | Admit: 2014-12-27 | Discharge: 2014-12-27 | Disposition: A | Discharge status: Home / Self Care | Payer: Medicare Other | Source: Ambulatory Visit | Attending: Oncology | Admitting: Oncology

## 2014-12-27 DIAGNOSIS — R911 Solitary pulmonary nodule: Secondary | ICD-10-CM | POA: Diagnosis not present

## 2014-12-27 DIAGNOSIS — K8020 Calculus of gallbladder without cholecystitis without obstruction: Secondary | ICD-10-CM | POA: Insufficient documentation

## 2014-12-27 DIAGNOSIS — I700 Atherosclerosis of aorta: Secondary | ICD-10-CM | POA: Insufficient documentation

## 2014-12-27 DIAGNOSIS — C3412 Malignant neoplasm of upper lobe, left bronchus or lung: Secondary | ICD-10-CM | POA: Diagnosis not present

## 2014-12-27 DIAGNOSIS — R590 Localized enlarged lymph nodes: Secondary | ICD-10-CM | POA: Insufficient documentation

## 2014-12-27 DIAGNOSIS — I2510 Atherosclerotic heart disease of native coronary artery without angina pectoris: Secondary | ICD-10-CM | POA: Diagnosis not present

## 2014-12-27 LAB — GLUCOSE, CAPILLARY: GLUCOSE-CAPILLARY: 95 mg/dL (ref 65–99)

## 2014-12-27 MED ORDER — FLUDEOXYGLUCOSE F - 18 (FDG) INJECTION
12.3500 | Freq: Once | INTRAVENOUS | Status: DC | PRN
  Administered 2014-12-27: 12.35 via INTRAVENOUS
  Filled 2014-12-27: qty 12.35

## 2014-12-29 ENCOUNTER — Inpatient Hospital Stay: Payer: Medicare Other | Admitting: Oncology

## 2014-12-29 ENCOUNTER — Inpatient Hospital Stay: Payer: Medicare Other

## 2014-12-29 ENCOUNTER — Inpatient Hospital Stay (HOSPITAL_BASED_OUTPATIENT_CLINIC_OR_DEPARTMENT_OTHER): Payer: Medicare Other

## 2014-12-29 ENCOUNTER — Encounter: Payer: Self-pay | Admitting: Oncology

## 2014-12-29 VITALS — BP 154/82 | HR 93 | Temp 97.1°F | Wt 164.4 lb

## 2014-12-29 VITALS — BP 118/74 | HR 97 | Temp 96.0°F | Resp 18

## 2014-12-29 DIAGNOSIS — C801 Malignant (primary) neoplasm, unspecified: Secondary | ICD-10-CM

## 2014-12-29 DIAGNOSIS — C787 Secondary malignant neoplasm of liver and intrahepatic bile duct: Secondary | ICD-10-CM

## 2014-12-29 DIAGNOSIS — C3412 Malignant neoplasm of upper lobe, left bronchus or lung: Secondary | ICD-10-CM

## 2014-12-29 DIAGNOSIS — Z5111 Encounter for antineoplastic chemotherapy: Secondary | ICD-10-CM | POA: Diagnosis not present

## 2014-12-29 LAB — COMPREHENSIVE METABOLIC PANEL
ALT: 15 U/L (ref 14–54)
ANION GAP: 8 (ref 5–15)
AST: 28 U/L (ref 15–41)
Albumin: 3.8 g/dL (ref 3.5–5.0)
Alkaline Phosphatase: 57 U/L (ref 38–126)
BUN: 9 mg/dL (ref 6–20)
CHLORIDE: 104 mmol/L (ref 101–111)
CO2: 27 mmol/L (ref 22–32)
Calcium: 9.3 mg/dL (ref 8.9–10.3)
Creatinine, Ser: 0.67 mg/dL (ref 0.44–1.00)
GFR calc non Af Amer: >60 mL/min (ref >60)
Glucose, Bld: 92 mg/dL (ref 65–99)
POTASSIUM: 3.3 mmol/L — AB (ref 3.5–5.1)
SODIUM: 139 mmol/L (ref 135–145)
Total Bilirubin: 0.9 mg/dL (ref 0.3–1.2)
Total Protein: 6.8 g/dL (ref 6.5–8.1)

## 2014-12-29 LAB — CBC WITH DIFFERENTIAL/PLATELET
Basophils Absolute: 0 10*3/uL (ref 0–0.1)
Basophils Relative: 1 %
EOS ABS: 0.1 10*3/uL (ref 0–0.7)
EOS PCT: 2 %
HCT: 35.3 % (ref 35.0–47.0)
Hemoglobin: 11.7 g/dL — ABNORMAL LOW (ref 12.0–16.0)
LYMPHS ABS: 1.1 10*3/uL (ref 1.0–3.6)
Lymphocytes Relative: 19 %
MCH: 32.6 pg (ref 26.0–34.0)
MCHC: 33.3 g/dL (ref 32.0–36.0)
MCV: 97.9 fL (ref 80.0–100.0)
Monocytes Absolute: 1.1 10*3/uL — ABNORMAL HIGH (ref 0.2–0.9)
Monocytes Relative: 18 %
Neutro Abs: 3.5 10*3/uL (ref 1.4–6.5)
Neutrophils Relative %: 60 %
PLATELETS: 259 10*3/uL (ref 150–440)
RBC: 3.6 MIL/uL — AB (ref 3.80–5.20)
RDW: 21.4 % — ABNORMAL HIGH (ref 11.5–14.5)
WBC: 5.8 10*3/uL (ref 3.6–11.0)

## 2014-12-29 MED ORDER — FAMOTIDINE IN NACL 20-0.9 MG/50ML-% IV SOLN
20.0000 mg | Freq: Once | INTRAVENOUS | Status: AC
  Administered 2014-12-29: 20 mg via INTRAVENOUS
  Filled 2014-12-29: qty 50

## 2014-12-29 MED ORDER — GEMCITABINE HCL CHEMO INJECTION 1 GM/26.3ML
1000.0000 mg/m2 | Freq: Once | INTRAVENOUS | Status: AC
  Administered 2014-12-29: 1786 mg via INTRAVENOUS
  Filled 2014-12-29: qty 41.71

## 2014-12-29 MED ORDER — LORATADINE 10 MG PO TABS
10.0000 mg | ORAL_TABLET | Freq: Once | ORAL | Status: AC
  Administered 2014-12-29: 10 mg via ORAL
  Filled 2014-12-29: qty 1

## 2014-12-29 MED ORDER — HEPARIN SOD (PORK) LOCK FLUSH 100 UNIT/ML IV SOLN
500.0000 [IU] | Freq: Once | INTRAVENOUS | Status: AC
  Administered 2014-12-29: 500 [IU] via INTRAVENOUS
  Filled 2014-12-29: qty 5

## 2014-12-29 MED ORDER — DIPHENHYDRAMINE HCL 50 MG/ML IJ SOLN: 25.0000 mg | Freq: Once | INTRAMUSCULAR | Status: DC

## 2014-12-29 MED ORDER — LORAZEPAM 2 MG/ML IJ SOLN
INTRAMUSCULAR | Status: AC
  Filled 2014-12-29: qty 1

## 2014-12-29 MED ORDER — DIPHENHYDRAMINE HCL 50 MG/ML IJ SOLN
50.0000 mg | Freq: Once | INTRAMUSCULAR | Status: AC
  Administered 2014-12-29: 50 mg via INTRAVENOUS
  Filled 2014-12-29: qty 1

## 2014-12-29 MED ORDER — SODIUM CHLORIDE 0.9 % IV SOLN
Freq: Once | INTRAVENOUS | Status: AC
  Administered 2014-12-29: 11:00:00 via INTRAVENOUS
  Filled 2014-12-29: qty 1000

## 2014-12-29 MED ORDER — SODIUM CHLORIDE 0.9 % IV SOLN
390.0000 mg | Freq: Once | INTRAVENOUS | Status: AC
  Administered 2014-12-29: 390 mg via INTRAVENOUS
  Filled 2014-12-29: qty 39

## 2014-12-29 MED ORDER — LORAZEPAM 2 MG/ML IJ SOLN
0.5000 mg | Freq: Once | INTRAMUSCULAR | Status: AC
  Administered 2014-12-29: 0.5 mg via INTRAVENOUS

## 2014-12-29 MED ORDER — SODIUM CHLORIDE 0.9 % IV SOLN
Freq: Once | INTRAVENOUS | Status: AC
  Administered 2014-12-29: 12:00:00 via INTRAVENOUS
  Filled 2014-12-29: qty 5

## 2014-12-29 MED ORDER — SODIUM CHLORIDE 0.9 % IJ SOLN
10.0000 mL | INTRAMUSCULAR | Status: DC | PRN
  Administered 2014-12-29: 10 mL via INTRAVENOUS
  Filled 2014-12-29: qty 10

## 2014-12-29 MED ORDER — PALONOSETRON HCL INJECTION 0.25 MG/5ML
0.2500 mg | Freq: Once | INTRAVENOUS | Status: AC
  Administered 2014-12-29: 0.25 mg via INTRAVENOUS
  Filled 2014-12-29: qty 5

## 2014-12-29 MED ORDER — HYDROCORTISONE NA SUCCINATE PF 100 MG IJ SOLR
100.0000 mg | Freq: Once | INTRAMUSCULAR | Status: AC
  Administered 2014-12-29: 100 mg via INTRAVENOUS

## 2014-12-29 MED ORDER — DIPHENHYDRAMINE HCL 50 MG/ML IJ SOLN
12.5000 mg | Freq: Once | INTRAMUSCULAR | Status: AC
  Administered 2014-12-29: 12.5 mg via INTRAVENOUS

## 2014-12-29 NOTE — Progress Notes (Signed)
[place] @ [facility] Telephone:([phone]) [phone]  Fax:([phone]) [place]: 10-11-1935  MR#: [id number]  OBS#:[id number]  Patient Care Team: Adin Hector, MD as PCP - General (Internal Medicine)  CHIEF COMPLAINT:  Chief Complaint  Patient presents with  . OTHER    Oncology History   Non-small cell carcinoma of lung T1 N2 M1stage IV diseasePET scan is consistent with one small lesion in the T11 as well as suspected lesion in liver (diagnoses was in July of 2015) right upper lobe tumor with metastases to the left liver and mediastinal lymph node  MRI of liver conforms metastases to the liver.  MRI of thoracic spine raises possibility of metastases to the spine however this is inconclusive study.  (August, 2015) 2.  Patient was started on carboplatinum and Taxol started on November 25, 2013. 6 cycle of chemotherapy with carboplatinum and Taxol.  Last treatment was March 16, 2014 7.because of progressing disease on CT scan patient has been started on NIVO   in  February of 2016 8.  Patient is off NIVO because of diarrhea (June, 2016) 7.  Will be started on carboplatinum and gemcitabine from July, 19th, 2016     Cancer, metastatic to liver   03/24/2014 Initial Diagnosis Cancer, metastatic to liver    Cancer of lung, upper lobe   03/24/2014 Initial Diagnosis Cancer of lung, upper lobe    Oncology Flowsheet 08/18/2014 10/27/2014 11/03/2014 11/17/2014 11/24/2014 12/08/2014  Day, Cycle - Day 1, Cycle 1 Day 8, Cycle 1 Day 1, Cycle 2 Day 8, Cycle 2 Day 1, Cycle 3  CARBOplatin (PARAPLATIN) IV - 390 mg - 390 mg - 390 mg  dexamethasone (DECADRON) IV - [ 12 mg ] - [ 12 mg ] - [ 12 mg ]  fosaprepitant (EMEND) IV - [ 150 mg ] - [ 150 mg ] - [ 150 mg ]  gemcitabine (GEMZAR) IV - 1,000 mg/m2 1,000 mg/m2 1,000 mg/m2 1,000 mg/m2 1,000 mg/m2  nivolumab (OPDIVO) IV 200 mg - - - - -  ondansetron (ZOFRAN) IV - - [ 8 mg ] [ 8 mg ] [ 8 mg ] -  palonosetron (ALOXI) IV - 0.25 mg -  0.25 mg - 0.25 mg    INTERVAL HISTORY:  79 year old lady who developed diarrhea secondary to   [place]. Marland Kitchen Patient was off prednisone and had again loose stool 2 were 3 per day.  Here for further follow-up no chills.  No fever.  No nausea.  No vomiting.. November 03, 2014 Patient is here for ongoing evaluation and day 8 chemotherapy with gemcitabine.  Patient is being seen because of previous history of diarrhea.  Diarrhea is resolved.  Patient is on prednisone 10 mg and hyoscyamine .  She is feeling better.  Appetite is improving.  No abdominal pain.  No cough.  No shortness of breath. November 16, 2014 Patient is here for second cycle of chemotherapy with recurrent carcinoma of lung Patient is tolerating treatment very well.  There was thrombocytopenia last week but patient has recovered.  No chills.  No fever.  Diarrhea is improved.  We will decrease prednisone to 10 mg every other day November 12 2014 Patient is here for ongoing evaluation and consideration of continuation of chemotherapy.  Diarrhea has improved.  Patient is off prednisone.  No chills fever no abdominal pain appetite is improving gaining weight  REVIEW OF SYSTEMS:   GENERAL:  Feels good.  Active.  No fevers,  sweats or weight loss. PERFORMANCE STATUS (ECOG):  01 HEENT:  No visual changes, runny nose, sore throat, mouth sores or tenderness. Lungs: No shortness of breath or cough.  No hemoptysis. Cardiac:  No chest pain, palpitations, orthopnea, or PND. GI: Diarrhea is improving Abdominal discomfort.  No blood or mucus in the stool.  Watery stool 2 or 3 a day.  No chills.  No fever. GU:  No urgency, frequency, dysuria, or hematuria. Musculoskeletal:  No back pain.  No joint pain.  No muscle tenderness. Extremities:  No pain or swelling. Skin:  No rashes or skin changes. Neuro:  No headache, numbness or weakness, balance or coordination issues. Endocrine:  No diabetes, thyroid issues, hot flashes or night sweats. Psych:  No  mood changes, depression or anxiety. Pain:  No focal pain. Review of systems:  All other systems reviewed and found to be negative.  As per HPI. Otherwise, a complete review of systems is negatve.  PAST MEDICAL HISTORY: Past Medical History  Diagnosis Date  . Lung cancer non small cell    PAST SURGICAL HISTORY: No significant past surgical history other than mentioned below FAMILY HISTORY There is no significant family history of breast cancer, ovarian cancer, colon cancer Smoking History 0.5(1)60 years(1)  PFSH: Comments: family history of colon cancer cancer of uterus and history of hypertension and heart disease  Social History: positive tobacco  Smoker: Smoking cessation counseling performed  Smoker- Packs Per Day: 0.5  Smoker- How Many Years: 11-  Additional Past Medical and Surgical History: As mentioned above         ADVANCED DIRECTIVES: Patient does not have any advanced healthcare directive. Information has been given.   HEALTH MAINTENANCE: Social History  Substance Use Topics  . Smoking status: Current Every Day Smoker -- 0.50 packs/day for 60 years  . Smokeless tobacco: None  . Alcohol Use: None       Allergies  Allergen Reactions  . Codeine Other (See Comments) and Nausea Only    GI Distress  . Hydrochlorothiazide Other (See Comments)    hypercalcemia  . Prednisone Swelling      OBJECTIVE:  Filed Vitals:   12/29/14 0901  BP: 154/82  Pulse: 93  Temp: 97.1 F (36.2 C)     Body mass index is 31.07 kg/(m^2).    ECOG FS:1 - Symptomatic but completely ambulatory  PHYSICAL EXAM: Goal status: Performance status is good.  Patient has not lost significant weight HEENT: No evidence of stomatitis. Sclera and conjunctivae :: No jaundice.   pale looking. Lungs: Air  entry equal on both sides.  No rhonchi.  No rales.  Cardiac: Heart sounds are normal.  No pericardial rub.  No murmur. Lymphatic system: Cervical, axillary, inguinal, lymph nodes not  palpable GI: Abdomen is soft.  No ascites.  Liver spleen not palpable.  No tenderness.  Bowel sounds are within normal limit Lower extremity: No edema Neurological system: Higher functions, cranial nerves intact no evidence of peripheral neuropathy. Skin: No rash.  No ecchymosis.. NO clinical signs of dehydration LAB RESULTS:  Infusion on 12/29/2014  Component Date Value Ref Range Status  . WBC 12/29/2014 5.8  3.6 - 11.0 K/uL Final  . RBC 12/29/2014 3.60* 3.80 - 5.20 MIL/uL Final  . Hemoglobin 12/29/2014 11.7* 12.0 - 16.0 g/dL Final  . HCT 12/29/2014 35.3  35.0 - 47.0 % Final  . MCV 12/29/2014 97.9  80.0 - 100.0 fL Final  . MCH 12/29/2014 32.6  26.0 - 34.0 pg Final  .  MCHC 12/29/2014 33.3  32.0 - 36.0 g/dL Final  . RDW 12/29/2014 21.4* 11.5 - 14.5 % Final  . Platelets 12/29/2014 259  150 - 440 K/uL Final  . Neutrophils Relative % 12/29/2014 60   Final  . Neutro Abs 12/29/2014 3.5  1.4 - 6.5 K/uL Final  . Lymphocytes Relative 12/29/2014 19   Final  . Lymphs Abs 12/29/2014 1.1  1.0 - 3.6 K/uL Final  . Monocytes Relative 12/29/2014 18   Final  . Monocytes Absolute 12/29/2014 1.1* 0.2 - 0.9 K/uL Final  . Eosinophils Relative 12/29/2014 2   Final  . Eosinophils Absolute 12/29/2014 0.1  0 - 0.7 K/uL Final  . Basophils Relative 12/29/2014 1   Final  . Basophils Absolute 12/29/2014 0.0  0 - 0.1 K/uL Final  Hospital Outpatient Visit on 12/27/2014  Component Date Value Ref Range Status  . Glucose-Capillary 12/27/2014 95  65 - 99 mg/dL Final      STUDIES: IMPRESSION: 1. Interval response to therapy. 2. There has been mild decrease in size and degree of FDG uptake associated with the dominant anterior right upper lobe lung nodule. 3. Persistent but improved FDG uptake associated with the right hilar adenopathy. Right paratracheal hypermetabolic node has resolved in the interval. 4. Aortic atherosclerosis as well as 3 vessel coronary artery calcification. 5.  Gallstones ASSESSMENT: Carcinoma of lung, non-small cell type.  Stage IV patient was on NIVOLULAMAB NIVOLULAMAB was discontinued because of persistent diarrhea Patient has been started on carboplatinum and gemcitabine Pet   scan has been reviewed independently and shows interval response to the chemotherapy Scan has been reviewed with the patient and compared with the previous PET scan Patient will continue cycle 4 with carboplatinum and gemcitabine   Cancer of lung, upper lobe   Staging form: Lung, AJCC 7th Edition     Clinical: Stage IV (T1, N2, M1b) - Marni Griffon, MD   12/29/2014 9:26 AM

## 2014-12-29 NOTE — Progress Notes (Signed)
Patient does not have living will.  Currently smokes.  Patient has stiff neck from sleeping in rocking chair.  Also having elbow pain on left side.

## 2014-12-29 NOTE — Progress Notes (Signed)
I was called to see this patient in infusion center because patient has developed redness in both upper extremity and year after carboplatinum was given.  Patient had a similar reaction to carboplatinum in the past one time.  Mild shortness of breath.  No chills fever. Carboplatinum was completely finished. On examination vital signs her been reviewed documented by nurses there was no hypoxia blood pressure was stable Lungs there was occasional wheezing. Cardiac: Tachycardia. Abdomen was soft. Hands were red and earlobes were red.  There was no other rash.  Neurologically patient was stable. Patient was given Benadryl 12.5 mg there was some improvement lorazepam was given 0.5 milligram because of her anxiety.  This was then reevaluated redness improved but still persisted so Solu-Cortef was given 100 mg.  Following that rhonchi diminished.  Tachycardia was gradually resolving. Patient would be up served in chemotherapy infusion area Assessment was reaction to carboplatinum Plan either to change treatment with cis-platinum or try during next carboplatinum preparation by prednisone 50 mg 13 hours 7 hours and hour before Benadryl 50 mg and Zantac 150 mgm 1 hour before chemotherapy The patient continues to have reaction than carboplatinum would be dropped.  This has been discussed with the patient. More than 40 minutes was spent face to face evaluation and reevaluation of the patient and the instruction regarding Treatment.  The nurses in infusion nurses regarding further treatment

## 2014-12-29 NOTE — Progress Notes (Signed)
14:11: Patient complains of having upset stomach, itching hands and feet, feeling flush.  Carboplatin was just finishing up - stopped, took patient's vitals (see flowsheet), called Dr. Oliva Bustard - he ordered 12.5 mg Benadryl and 0.5 mg Ativan.  Administered medications, continued to take vitals periodically.  Dr. Oliva Bustard came to observe patient, lungs clear.  Ordered Solucortef 100 mg for itching hands and feet and continue to monitor patient.  At 1510 patient reported symptoms resolving, checked vitals which were within normal limits, called Dr. Oliva Bustard for further instruction.  He said patient could be discharged from Infusion area to go home.

## 2015-01-05 ENCOUNTER — Inpatient Hospital Stay: Payer: Medicare Other

## 2015-01-05 VITALS — BP 152/84 | HR 85 | Temp 96.7°F | Resp 20

## 2015-01-05 DIAGNOSIS — C787 Secondary malignant neoplasm of liver and intrahepatic bile duct: Secondary | ICD-10-CM

## 2015-01-05 DIAGNOSIS — Z5111 Encounter for antineoplastic chemotherapy: Secondary | ICD-10-CM | POA: Diagnosis not present

## 2015-01-05 LAB — CBC WITH DIFFERENTIAL/PLATELET
BASOS ABS: 0.1 10*3/uL (ref 0–0.1)
BASOS PCT: 2 %
EOS ABS: 0.1 10*3/uL (ref 0–0.7)
EOS PCT: 2 %
HCT: 34.5 % — ABNORMAL LOW (ref 35.0–47.0)
Hemoglobin: 11.7 g/dL — ABNORMAL LOW (ref 12.0–16.0)
Lymphocytes Relative: 42 %
Lymphs Abs: 2 10*3/uL (ref 1.0–3.6)
MCH: 32.6 pg (ref 26.0–34.0)
MCHC: 33.9 g/dL (ref 32.0–36.0)
MCV: 96.2 fL (ref 80.0–100.0)
MONO ABS: 0.3 10*3/uL (ref 0.2–0.9)
MONOS PCT: 7 %
Neutro Abs: 2.3 10*3/uL (ref 1.4–6.5)
Neutrophils Relative %: 49 %
PLATELETS: 136 10*3/uL — AB (ref 150–440)
RBC: 3.59 MIL/uL — ABNORMAL LOW (ref 3.80–5.20)
RDW: 20 % — AB (ref 11.5–14.5)
WBC: 4.7 10*3/uL (ref 3.6–11.0)

## 2015-01-05 MED ORDER — SODIUM CHLORIDE 0.9 % IV SOLN
1000.0000 mg/m2 | Freq: Once | INTRAVENOUS | Status: AC
  Administered 2015-01-05: 1786 mg via INTRAVENOUS
  Filled 2015-01-05: qty 47

## 2015-01-05 MED ORDER — SODIUM CHLORIDE 0.9 % IJ SOLN
10.0000 mL | INTRAMUSCULAR | Status: DC | PRN
  Filled 2015-01-05: qty 10

## 2015-01-05 MED ORDER — SODIUM CHLORIDE 0.9 % IV SOLN
Freq: Once | INTRAVENOUS | Status: AC
  Administered 2015-01-05: 15:00:00 via INTRAVENOUS
  Filled 2015-01-05: qty 1000

## 2015-01-05 MED ORDER — HEPARIN SOD (PORK) LOCK FLUSH 100 UNIT/ML IV SOLN
500.0000 [IU] | Freq: Once | INTRAVENOUS | Status: DC | PRN
  Filled 2015-01-05: qty 5

## 2015-01-05 MED ORDER — SODIUM CHLORIDE 0.9 % IV SOLN
Freq: Once | INTRAVENOUS | Status: AC
  Administered 2015-01-05: 15:00:00 via INTRAVENOUS
  Filled 2015-01-05: qty 4

## 2015-01-11 ENCOUNTER — Telehealth: Payer: Self-pay | Admitting: *Deleted

## 2015-01-11 DIAGNOSIS — C3410 Malignant neoplasm of upper lobe, unspecified bronchus or lung: Secondary | ICD-10-CM

## 2015-01-11 DIAGNOSIS — R200 Anesthesia of skin: Secondary | ICD-10-CM

## 2015-01-11 NOTE — Addendum Note (Signed)
Addended by: Betti Cruz on: 01/11/2015 03:03 PM   Modules accepted: Orders

## 2015-01-11 NOTE — Telephone Encounter (Signed)
Had chemo last week and for past 3 days has had numbness of right side of nose, lips and tongue. Asking if anything needs to be done about it. States it's like when you go to the dentist and get novacaine type feeling

## 2015-01-11 NOTE — Addendum Note (Signed)
Addended by: Betti Cruz on: 01/11/2015 04:41 PM   Modules accepted: Orders

## 2015-01-11 NOTE — Telephone Encounter (Signed)
MRI Brain with contrast  Tomorrow and see md after scan done. Patinet nformed that scheduler will call her with appt times

## 2015-01-12 ENCOUNTER — Ambulatory Visit: Payer: Medicare Other

## 2015-01-12 ENCOUNTER — Inpatient Hospital Stay: Payer: Medicare Other

## 2015-01-12 ENCOUNTER — Inpatient Hospital Stay: Payer: Medicare Other | Attending: Oncology | Admitting: Oncology

## 2015-01-12 ENCOUNTER — Other Ambulatory Visit: Payer: Medicare Other

## 2015-01-12 ENCOUNTER — Ambulatory Visit
Admission: RE | Admit: 2015-01-12 | Discharge: 2015-01-12 | Disposition: A | Discharge status: Home / Self Care | Payer: Medicare Other | Source: Ambulatory Visit | Attending: Oncology | Admitting: Oncology

## 2015-01-12 ENCOUNTER — Encounter: Payer: Self-pay | Admitting: Oncology

## 2015-01-12 VITALS — BP 170/85 | HR 116 | Temp 96.3°F | Wt 162.0 lb

## 2015-01-12 DIAGNOSIS — Z7952 Long term (current) use of systemic steroids: Secondary | ICD-10-CM | POA: Insufficient documentation

## 2015-01-12 DIAGNOSIS — C7931 Secondary malignant neoplasm of brain: Secondary | ICD-10-CM | POA: Insufficient documentation

## 2015-01-12 DIAGNOSIS — Z803 Family history of malignant neoplasm of breast: Secondary | ICD-10-CM | POA: Diagnosis not present

## 2015-01-12 DIAGNOSIS — H532 Diplopia: Secondary | ICD-10-CM | POA: Insufficient documentation

## 2015-01-12 DIAGNOSIS — R200 Anesthesia of skin: Secondary | ICD-10-CM | POA: Insufficient documentation

## 2015-01-12 DIAGNOSIS — R197 Diarrhea, unspecified: Secondary | ICD-10-CM

## 2015-01-12 DIAGNOSIS — C771 Secondary and unspecified malignant neoplasm of intrathoracic lymph nodes: Secondary | ICD-10-CM | POA: Diagnosis not present

## 2015-01-12 DIAGNOSIS — F17210 Nicotine dependence, cigarettes, uncomplicated: Secondary | ICD-10-CM | POA: Diagnosis not present

## 2015-01-12 DIAGNOSIS — Z79899 Other long term (current) drug therapy: Secondary | ICD-10-CM | POA: Diagnosis not present

## 2015-01-12 DIAGNOSIS — C3410 Malignant neoplasm of upper lobe, unspecified bronchus or lung: Secondary | ICD-10-CM

## 2015-01-12 DIAGNOSIS — Z923 Personal history of irradiation: Secondary | ICD-10-CM | POA: Diagnosis not present

## 2015-01-12 DIAGNOSIS — C787 Secondary malignant neoplasm of liver and intrahepatic bile duct: Secondary | ICD-10-CM

## 2015-01-12 DIAGNOSIS — C3412 Malignant neoplasm of upper lobe, left bronchus or lung: Secondary | ICD-10-CM | POA: Insufficient documentation

## 2015-01-12 DIAGNOSIS — M542 Cervicalgia: Secondary | ICD-10-CM | POA: Diagnosis not present

## 2015-01-12 DIAGNOSIS — R270 Ataxia, unspecified: Secondary | ICD-10-CM | POA: Insufficient documentation

## 2015-01-12 LAB — CBC WITH DIFFERENTIAL/PLATELET
Basophils Absolute: 0 10*3/uL (ref 0–0.1)
Basophils Relative: 1 %
Eosinophils Absolute: 0 10*3/uL (ref 0–0.7)
Eosinophils Relative: 1 %
HEMATOCRIT: 35.1 % (ref 35.0–47.0)
HEMOGLOBIN: 11.8 g/dL — AB (ref 12.0–16.0)
LYMPHS ABS: 2 10*3/uL (ref 1.0–3.6)
MCH: 33.2 pg (ref 26.0–34.0)
MCHC: 33.8 g/dL (ref 32.0–36.0)
MCV: 98.2 fL (ref 80.0–100.0)
MONO ABS: 0.5 10*3/uL (ref 0.2–0.9)
Monocytes Relative: 13 %
NEUTROS ABS: 1.4 10*3/uL (ref 1.4–6.5)
Platelets: 60 10*3/uL — ABNORMAL LOW (ref 150–440)
RBC: 3.57 MIL/uL — ABNORMAL LOW (ref 3.80–5.20)
RDW: 20.6 % — AB (ref 11.5–14.5)
WBC: 4 10*3/uL (ref 3.6–11.0)

## 2015-01-12 MED ORDER — GADOBENATE DIMEGLUMINE 529 MG/ML IV SOLN
15.0000 mL | Freq: Once | INTRAVENOUS | Status: DC | PRN
Start: 2015-01-12 — End: 2015-01-13

## 2015-01-12 MED ORDER — DEXAMETHASONE 4 MG PO TABS: ORAL_TABLET | ORAL | Status: DC

## 2015-01-12 NOTE — Progress Notes (Signed)
Patient does not have living will.  Currently smokes.  Patient here today for MRI results regarding facial numbness.

## 2015-01-12 NOTE — Progress Notes (Signed)
t  [place] @ [facility] Telephone:(336) [phone]  Fax:(336) [place]: May 10, 1935  MR#: [id number]  HKV#:[id number]  Patient Care Team: Adin Hector, MD as PCP - General (Internal Medicine)  CHIEF COMPLAINT:  Chief Complaint  Patient presents with  . Results    Oncology History   Non-small cell carcinoma of lung T1 N2 M1stage IV diseasePET scan is consistent with one small lesion in the T11 as well as suspected lesion in liver (diagnoses was in July of 2015) right upper lobe tumor with metastases to the left liver and mediastinal lymph node  MRI of liver conforms metastases to the liver.  MRI of thoracic spine raises possibility of metastases to the spine however this is inconclusive study.  (August, 2015) 2.  Patient was started on carboplatinum and Taxol started on November 25, 2013. 6 cycle of chemotherapy with carboplatinum and Taxol.  Last treatment was March 16, 2014 7.because of progressing disease on CT scan patient has been started on NIVO   in  February of 2016 8.  Patient is off NIVO because of diarrhea (June, 2016) 7.  Will be started on carboplatinum and gemcitabine from July, 19th, 2016   8.  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  Diagnosis by MRI scan of brain October of 2016   Oncology Flowsheet 10/27/2014 11/03/2014 11/17/2014 11/24/2014 12/08/2014 12/29/2014 01/05/2015  Day, Cycle Day 1, Cycle 1 Day 8, Cycle 1 Day 1, Cycle 2 Day 8, Cycle 2 Day 1, Cycle 3 Day 1, Cycle 4 Day 8, Cycle 4  CARBOplatin (PARAPLATIN) IV 390 mg - 390 mg - 390 mg 390 mg -  dexamethasone (DECADRON) IV [ 12 mg ] - [ 12 mg ] - [ 12 mg ] [ 12 mg ] [ 10 mg ]  fosaprepitant (EMEND) IV [ 150 mg ] - [ 150 mg ] - [ 150 mg ] [ 150 mg ] -  gemcitabine (GEMZAR) IV 1,000 mg/m2 1,000 mg/m2 1,000 mg/m2 1,000 mg/m2 1,000 mg/m2 1,000 mg/m2 1,000 mg/m2  LORazepam (ATIVAN) IV - - - - - 0.5 mg -  nivolumab (OPDIVO) IV - - - - - - -  ondansetron (ZOFRAN) IV - [ 8 mg ] [ 8 mg ] [ 8 mg ] - - [  8 mg ]  palonosetron (ALOXI) IV 0.25 mg - 0.25 mg - 0.25 mg 0.25 mg -    INTERVAL HISTORY:  79 year old lady who developed diarrhea secondary to   [place]. Marland Kitchen Patient was off prednisone and had again loose stool 2 were 3 per day.  Here for further follow-up no chills.  No fever.  No nausea.  No vomiting.. 79 year old lady called yesterday with a history that acute onset of numbness on hop side of the face on the right side.  Started acutely. Patient does not have any headache.  Has some neck pain. Following that an MRI scan of the brain with contrast was ordered and patient is here for further evaluation and treatment consideration  REVIEW OF SYSTEMS:   GENERAL:  Feels good.  Active.  No fevers, sweats or weight loss. PERFORMANCE STATUS (ECOG):  01 HEENT:  No visual changes, runny nose, sore throat, mouth sores or tenderness. Lungs: No shortness of breath or cough.  No hemoptysis. Cardiac:  No chest pain, palpitations, orthopnea, or PND. GI: Diarrhea is improving Abdominal discomfort.  No blood or mucus in the stool.  Watery stool 2 or 3 a day.  No chills.  No fever. GU:  No urgency, frequency, dysuria, or hematuria. Musculoskeletal:  No back pain.  No joint pain.  No muscle tenderness. Extremities:  No pain or swelling. Skin:  No rashes or skin changes. Neurological system as mentioned in history of present illness Endocrine:  No diabetes, thyroid issues, hot flashes or night sweats. Psych:  No mood changes, depression or anxiety. Pain:  No focal pain. Review of systems:  All other systems reviewed and found to be negative.  As per HPI. Otherwise, a complete review of systems is negatve.  PAST MEDICAL HISTORY: Past Medical History  Diagnosis Date  . Lung cancer ([place]) non small cell    PAST SURGICAL HISTORY: No significant past surgical history other than mentioned below FAMILY HISTORY There is no significant family history of breast cancer, ovarian cancer, colon  cancer Smoking History 0.5(1)60 years(1)  PFSH: Comments: family history of colon cancer cancer of uterus and history of hypertension and heart disease  Social History: positive tobacco  Smoker: Smoking cessation counseling performed  Smoker- Packs Per Day: 0.5  Smoker- How Many Years: 5-  Additional Past Medical and Surgical History: As mentioned above         ADVANCED DIRECTIVES: Patient does not have any advanced healthcare directive. Information has been given.   HEALTH MAINTENANCE: Social History  Substance Use Topics  . Smoking status: Current Every Day Smoker -- 0.50 packs/day for 60 years  . Smokeless tobacco: None  . Alcohol Use: None       Allergies  Allergen Reactions  . Codeine Other (See Comments) and Nausea Only    GI Distress  . Hydrochlorothiazide Other (See Comments)    hypercalcemia  . Prednisone Swelling      OBJECTIVE:  Filed Vitals:   01/12/15 1639  BP: 170/85  Pulse: 116  Temp: 96.3 F (35.7 C)     Body mass index is 30.63 kg/(m^2).    ECOG FS:1 - Symptomatic but completely ambulatory  PHYSICAL EXAM: Goal status: Performance status is good.  Patient has not lost significant weight HEENT: No evidence of stomatitis. Sclera and conjunctivae :: No jaundice.   pale looking. Lungs: Air  entry equal on both sides.  No rhonchi.  No rales.  Cardiac: Heart sounds are normal.  No pericardial rub.  No murmur. Lymphatic system: Cervical, axillary, inguinal, lymph nodes not palpable GI: Abdomen is soft.  No ascites.  Liver spleen not palpable.  No tenderness.  Bowel sounds are within normal limit Lower extremity: No edema Neurological system: Higher functions, involvement of seventh nerve .  Other localizing sign. Upper and lower extremity deep motor and sensory system within normal limit Skin: No rash.  No ecchymosis.. NO clinical signs of dehydration LAB RESULTS:  Appointment on 01/12/2015  Component Date Value Ref Range Status  . WBC  01/12/2015 4.0  3.6 - 11.0 K/uL Final  . RBC 01/12/2015 3.57* 3.80 - 5.20 MIL/uL Final  . Hemoglobin 01/12/2015 11.8* 12.0 - 16.0 g/dL Final  . HCT 01/12/2015 35.1  35.0 - 47.0 % Final  . MCV 01/12/2015 98.2  80.0 - 100.0 fL Final  . MCH 01/12/2015 33.2  26.0 - 34.0 pg Final  . MCHC 01/12/2015 33.8  32.0 - 36.0 g/dL Final  . RDW 01/12/2015 20.6* 11.5 - 14.5 % Final  . Platelets 01/12/2015 60* 150 - 440 K/uL Final  . Neutrophils Relative % 01/12/2015 35%   Final  . Neutro Abs 01/12/2015 1.4  1.4 - 6.5 K/uL Final  . Lymphocytes  Relative 01/12/2015 52%   Final  . Lymphs Abs 01/12/2015 2.0  1.0 - 3.6 K/uL Final  . Monocytes Relative 01/12/2015 13%   Final  . Monocytes Absolute 01/12/2015 0.5  0.2 - 0.9 K/uL Final  . Eosinophils Relative 01/12/2015 1%   Final  . Eosinophils Absolute 01/12/2015 0.0  0 - 0.7 K/uL Final  . Basophils Relative 01/12/2015 1%   Final  . Basophils Absolute 01/12/2015 0.0  0 - 0.1 K/uL Final      STUDIES: IMPRESSION: 1. 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 The fourth ventricle remains patent and there is no significant intracranial mass effect at this time. 2. No other metastatic disease identified. 3. 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  Electronically Signed: By: Genevie Ann M.D. On: 01/12/2015 16:14 ASSESSMENT: Carcinoma of lung, non-small cell type.  Stage IV patient was on NIVOLULAMAB NIVOLULAMAB was discontinued because of persistent diarrhea Patient has been started on carboplatinum and gemcitabine January 12, 2015 Patient came with acute new problem with neurological symptoms of numbness. MRI scan has been ordered and reviewed independently also discussed with the radiologist.  Patient has a left brainstem metastasis disease with moderate brainstem edema. I reviewed the scan with the patient and family Possibility of stereotactic radiation  therapy can be considered.  Idiscuss situation with Dr. Donella Stade radiation oncologist. Patient was started on Decadron 12 mg daily for 15 days followed by 8 mg daily for 7 days followed by 4 mg daily for 7 days and discontinue Hold chemotherapy until radiation is finished. All the questions from the patient had been answered.  Total duration of visit was 45 minutes.  50% or more time was spent in counseling patient and family regarding prognosis and options of treatment and available resources      Cancer of lung, upper lobe   Staging form: Lung, AJCC 7th Edition     Clinical: Stage IV (T1, N2, M1b) - Marni Griffon, MD   01/12/2015 8:33 PM

## 2015-01-13 ENCOUNTER — Encounter: Payer: Self-pay | Admitting: Radiation Oncology

## 2015-01-13 ENCOUNTER — Ambulatory Visit
Admission: RE | Admit: 2015-01-13 | Discharge: 2015-01-13 | Disposition: A | Discharge status: Home / Self Care | Payer: Medicare Other | Source: Ambulatory Visit | Attending: Radiation Oncology | Admitting: Radiation Oncology

## 2015-01-13 VITALS — BP 148/86 | HR 103 | Temp 97.8°F | Resp 18 | Wt 162.7 lb

## 2015-01-13 DIAGNOSIS — C3411 Malignant neoplasm of upper lobe, right bronchus or lung: Secondary | ICD-10-CM | POA: Insufficient documentation

## 2015-01-13 DIAGNOSIS — Z510 Encounter for antineoplastic radiation therapy: Secondary | ICD-10-CM | POA: Insufficient documentation

## 2015-01-13 DIAGNOSIS — C7931 Secondary malignant neoplasm of brain: Secondary | ICD-10-CM | POA: Insufficient documentation

## 2015-01-13 DIAGNOSIS — Z79899 Other long term (current) drug therapy: Secondary | ICD-10-CM | POA: Insufficient documentation

## 2015-01-13 DIAGNOSIS — Z720 Tobacco use: Secondary | ICD-10-CM | POA: Insufficient documentation

## 2015-01-13 NOTE — Consult Note (Signed)
Except an outstanding is perfect of Radiation Oncology NEW PATIENT EVALUATION  Name: Jill Richards  MRN: [id number]  Date:   01/13/2015     DOB: November 21, 1935   This 79 y.o. female patient presents to the clinic for initial evaluation of stage IV lung cancer with brain stem metastasis.  REFERRING PHYSICIAN: Adin Hector, MD  CHIEF COMPLAINT:  Chief Complaint  Patient presents with  . Cancer    Pt is here for initial consultation for brain mets.      DIAGNOSIS: The encounter diagnosis was Brain metastasis ([place]).   PREVIOUS INVESTIGATIONS:  MRI scan of brain reviewed PET CT scan reviewed Clinical notes reviewed Pathology reports reviewed  HPI: Patient is a pleasant 79 year old female diagnosed in July 2015 with stage IV (T1 N2 M1) non-small cell lung cancer with suspected T11 metastasis as well as liver metastasis. Primary tumor was right upper lobe. MRI of liver confirm metastatic disease she's been treated with carboplatinum Taxol although progressed on CT scan and had been started on NIVO. That had to be discontinued secondary to diarrhea. Recently presented with some right facial weakness and diplopia and MRI scan performed showed solitary lesion in the brainstem. Lesion was 1.3 cm in greatest dimension with moderate brainstem edema. Fourth ventricle was patent. She is seen today for consideration of palliative radiation therapy. She's having no pulmonary symptoms. No cough hemoptysis or chest tightness. PET CT of the chest shows good resolution of the right upper lobe mass with no evidence of pending atelectasis or hilar involvement.  PLANNED TREATMENT REGIMEN: I MRT radiation therapy to brainstem  PAST MEDICAL HISTORY:  has a past medical history of Lung cancer ([place]) (non small cell).    PAST SURGICAL HISTORY: History reviewed. No pertinent past surgical history.  FAMILY HISTORY: family history is not on file.  SOCIAL HISTORY:  reports that she has been smoking.  She  does not have any smokeless tobacco history on file.  ALLERGIES: Codeine; Hydrochlorothiazide; and Prednisone  MEDICATIONS:  Current Outpatient Prescriptions  Medication Sig Dispense Refill  . albuterol (PROVENTIL HFA;VENTOLIN HFA) 108 (90 BASE) MCG/ACT inhaler Inhale 2 puffs into the lungs every 6 (six) hours as needed for wheezing or shortness of breath.    Marland Kitchen amLODipine (NORVASC) 10 MG tablet Take 10 mg by mouth daily.    . benzonatate (TESSALON) 200 MG capsule Take by mouth.    . cetirizine (ZYRTEC) 10 MG tablet Take 10 mg by mouth daily.    . Cholecalciferol (VITAMIN D3) 2000 UNITS TABS Take 1 capsule by mouth daily.    Marland Kitchen dexamethasone (DECADRON) 4 MG tablet Take 3 tabs po x 15 days, then take 2 tabs po x 7 days, then take 1 tab po x 7 days and stop 66 tablet 0  . GuaiFENesin (MUCUS RELIEF ADULT PO) Take 1 tablet by mouth daily as needed.    . hyoscyamine (LEVBID) 0.375 MG 12 hr tablet Take 1 tablet (0.375 mg total) by mouth 2 (two) times daily. 60 tablet 1  . levothyroxine (SYNTHROID, LEVOTHROID) 125 MCG tablet Take 125 mcg by mouth daily before breakfast.    . Multiple Vitamin (MULTIVITAMIN WITH MINERALS) TABS tablet Take 1 tablet by mouth daily.    . ondansetron (ZOFRAN) 4 MG tablet Take 4 mg by mouth every 6 (six) hours as needed for nausea or vomiting.    . Probiotic Product (PROBIOTIC FORMULA PO) Take 1 tablet by mouth daily.     No current facility-administered medications for this  encounter.    ECOG PERFORMANCE STATUS:  1 - Symptomatic but completely ambulatory  REVIEW OF SYSTEMS: Except for the diplopia and some right facial numbness Patient denies any weight loss, fatigue, weakness, fever, chills or night sweats. Patient denies any loss of vision, blurred vision. Patient denies any ringing  of the ears or hearing loss. No irregular heartbeat. Patient denies heart murmur or history of fainting. Patient denies any chest pain or pain radiating to her upper extremities. Patient  denies any shortness of breath, difficulty breathing at night, cough or hemoptysis. Patient denies any swelling in the lower legs. Patient denies any nausea vomiting, vomiting of blood, or coffee ground material in the vomitus. Patient denies any stomach pain. Patient states has had normal bowel movements no significant constipation or diarrhea. Patient denies any dysuria, hematuria or significant nocturia. Patient denies any problems walking, swelling in the joints or loss of balance. Patient denies any skin changes, loss of hair or loss of weight. Patient denies any excessive worrying or anxiety or significant depression. Patient denies any problems with insomnia. Patient denies excessive thirst, polyuria, polydipsia. Patient denies any swollen glands, patient denies easy bruising or easy bleeding. Patient denies any recent infections, allergies or URI. Patient "s visual fields have not changed significantly in recent time.    PHYSICAL EXAM: BP 148/86 mmHg  Pulse 103  Temp(Src) 97.8 F (36.6 C)  Resp 18  Wt 162 lb 11.2 oz (73.8 kg) Well-developed elderly female in NAD. Crude visual fields are within normal range motor sensory and DTR levels are equal and symmetric in the upper lower extremities. Proprioception is intact. Cranial nerves II through XII appear intact except for possible right seventh nerve palsy. Well-developed well-nourished patient in NAD. HEENT reveals PERLA, EOMI, discs not visualized.  Oral cavity is clear. No oral mucosal lesions are identified. Neck is clear without evidence of cervical or supraclavicular adenopathy. Lungs are clear to A&P. Cardiac examination is essentially unremarkable with regular rate and rhythm without murmur rub or thrill. Abdomen is benign with no organomegaly or masses noted. Motor sensory and DTR levels are equal and symmetric in the upper and lower extremities. Cranial nerves II through XII are grossly intact. Proprioception is intact. No peripheral  adenopathy or edema is identified. No motor or sensory levels are noted. Crude visual fields are within normal range.  LABORATORY DATA: Prior pathology reports reviewed    RADIOLOGY RESULTS: MRI of brain and PET CT scan are reviewed and compatible with the above-stated findings   IMPRESSION: Progressive non-small cell lung cancer now with brainstem metastasis for I am RT treatment.  PLAN: Patient has been started on steroidal. I like to go ahead with 2400 cGy in 4 fractions using I am RT treatment planning and delivery to the brainstem solitary metastasis. Will use and MRI fusion study for treatment planning. Risks and benefits of treatment including hair loss, fatigue, alteration of blood counts and skin reaction all were discussed in detail with the patient and her family. I have set up and ordered CT simulation for tomorrow. I do not think at this time we need to touch of the lung with any palliative radiation therapy since she's having no symptoms and is had a good response from her chemotherapy regiment.  I would like to take this opportunity for allowing me to participate in the care of your patient.Armstead Peaks., MD

## 2015-01-14 ENCOUNTER — Ambulatory Visit
Admission: RE | Admit: 2015-01-14 | Discharge: 2015-01-14 | Disposition: A | Discharge status: Home / Self Care | Payer: Medicare Other | Source: Ambulatory Visit | Attending: Radiation Oncology | Admitting: Radiation Oncology

## 2015-01-14 DIAGNOSIS — C3411 Malignant neoplasm of upper lobe, right bronchus or lung: Secondary | ICD-10-CM | POA: Diagnosis not present

## 2015-01-14 DIAGNOSIS — C7931 Secondary malignant neoplasm of brain: Secondary | ICD-10-CM | POA: Diagnosis present

## 2015-01-14 DIAGNOSIS — Z510 Encounter for antineoplastic radiation therapy: Secondary | ICD-10-CM | POA: Diagnosis not present

## 2015-01-14 DIAGNOSIS — Z720 Tobacco use: Secondary | ICD-10-CM | POA: Diagnosis not present

## 2015-01-14 DIAGNOSIS — Z79899 Other long term (current) drug therapy: Secondary | ICD-10-CM | POA: Diagnosis not present

## 2015-01-17 DIAGNOSIS — C7931 Secondary malignant neoplasm of brain: Secondary | ICD-10-CM | POA: Diagnosis not present

## 2015-01-19 ENCOUNTER — Ambulatory Visit: Payer: Medicare Other

## 2015-01-19 ENCOUNTER — Ambulatory Visit
Admission: RE | Admit: 2015-01-19 | Discharge: 2015-01-19 | Disposition: A | Discharge status: Home / Self Care | Payer: Medicare Other | Source: Ambulatory Visit | Attending: Radiation Oncology | Admitting: Radiation Oncology

## 2015-01-19 ENCOUNTER — Ambulatory Visit: Payer: Medicare Other | Admitting: Oncology

## 2015-01-19 ENCOUNTER — Other Ambulatory Visit: Payer: Medicare Other

## 2015-01-19 DIAGNOSIS — C7931 Secondary malignant neoplasm of brain: Secondary | ICD-10-CM | POA: Diagnosis not present

## 2015-01-20 ENCOUNTER — Ambulatory Visit
Admission: RE | Admit: 2015-01-20 | Discharge: 2015-01-20 | Disposition: A | Discharge status: Home / Self Care | Payer: Medicare Other | Source: Ambulatory Visit | Attending: Radiation Oncology | Admitting: Radiation Oncology

## 2015-01-20 ENCOUNTER — Other Ambulatory Visit: Payer: Self-pay | Admitting: *Deleted

## 2015-01-20 DIAGNOSIS — C7931 Secondary malignant neoplasm of brain: Secondary | ICD-10-CM | POA: Diagnosis not present

## 2015-01-20 MED ORDER — SUCRALFATE 1 G PO TABS: 1.0000 g | ORAL_TABLET | Freq: Three times a day (TID) | ORAL | Status: DC

## 2015-01-21 ENCOUNTER — Ambulatory Visit
Admission: RE | Admit: 2015-01-21 | Discharge: 2015-01-21 | Disposition: A | Discharge status: Home / Self Care | Payer: Medicare Other | Source: Ambulatory Visit | Attending: Radiation Oncology | Admitting: Radiation Oncology

## 2015-01-21 DIAGNOSIS — C7931 Secondary malignant neoplasm of brain: Secondary | ICD-10-CM | POA: Diagnosis not present

## 2015-01-24 ENCOUNTER — Ambulatory Visit
Admission: RE | Admit: 2015-01-24 | Discharge: 2015-01-24 | Disposition: A | Discharge status: Home / Self Care | Payer: Medicare Other | Source: Ambulatory Visit | Attending: Radiation Oncology | Admitting: Radiation Oncology

## 2015-01-24 DIAGNOSIS — C7931 Secondary malignant neoplasm of brain: Secondary | ICD-10-CM | POA: Diagnosis not present

## 2015-01-25 ENCOUNTER — Other Ambulatory Visit: Payer: Self-pay | Admitting: *Deleted

## 2015-01-25 ENCOUNTER — Ambulatory Visit
Admission: RE | Admit: 2015-01-25 | Discharge: 2015-01-25 | Disposition: A | Discharge status: Home / Self Care | Payer: Medicare Other | Source: Ambulatory Visit | Attending: Radiation Oncology | Admitting: Radiation Oncology

## 2015-01-25 DIAGNOSIS — C7931 Secondary malignant neoplasm of brain: Secondary | ICD-10-CM | POA: Diagnosis not present

## 2015-01-25 MED ORDER — FLUCONAZOLE 100 MG PO TABS: 100.0000 mg | ORAL_TABLET | Freq: Every day | ORAL | Status: DC

## 2015-01-26 ENCOUNTER — Encounter: Payer: Self-pay | Admitting: Oncology

## 2015-01-26 ENCOUNTER — Inpatient Hospital Stay (HOSPITAL_BASED_OUTPATIENT_CLINIC_OR_DEPARTMENT_OTHER): Payer: Medicare Other | Admitting: Oncology

## 2015-01-26 ENCOUNTER — Inpatient Hospital Stay: Payer: Medicare Other

## 2015-01-26 VITALS — BP 143/81 | HR 71 | Temp 97.9°F | Wt 163.8 lb

## 2015-01-26 DIAGNOSIS — C771 Secondary and unspecified malignant neoplasm of intrathoracic lymph nodes: Secondary | ICD-10-CM

## 2015-01-26 DIAGNOSIS — C3412 Malignant neoplasm of upper lobe, left bronchus or lung: Secondary | ICD-10-CM

## 2015-01-26 DIAGNOSIS — H532 Diplopia: Secondary | ICD-10-CM

## 2015-01-26 DIAGNOSIS — C7931 Secondary malignant neoplasm of brain: Secondary | ICD-10-CM | POA: Diagnosis not present

## 2015-01-26 DIAGNOSIS — R200 Anesthesia of skin: Secondary | ICD-10-CM

## 2015-01-26 DIAGNOSIS — F17210 Nicotine dependence, cigarettes, uncomplicated: Secondary | ICD-10-CM

## 2015-01-26 DIAGNOSIS — C787 Secondary malignant neoplasm of liver and intrahepatic bile duct: Secondary | ICD-10-CM

## 2015-01-26 DIAGNOSIS — C3410 Malignant neoplasm of upper lobe, unspecified bronchus or lung: Secondary | ICD-10-CM

## 2015-01-26 DIAGNOSIS — Z79899 Other long term (current) drug therapy: Secondary | ICD-10-CM

## 2015-01-26 DIAGNOSIS — J449 Chronic obstructive pulmonary disease, unspecified: Secondary | ICD-10-CM | POA: Insufficient documentation

## 2015-01-26 DIAGNOSIS — M542 Cervicalgia: Secondary | ICD-10-CM

## 2015-01-26 DIAGNOSIS — R197 Diarrhea, unspecified: Secondary | ICD-10-CM

## 2015-01-26 DIAGNOSIS — R270 Ataxia, unspecified: Secondary | ICD-10-CM

## 2015-01-26 DIAGNOSIS — Z923 Personal history of irradiation: Secondary | ICD-10-CM

## 2015-01-26 LAB — CBC WITH DIFFERENTIAL/PLATELET
Basophils Absolute: 0 10*3/uL (ref 0–0.1)
Basophils Relative: 0 %
Eosinophils Absolute: 0 10*3/uL (ref 0–0.7)
Eosinophils Relative: 0 %
HCT: 39.7 % (ref 35.0–47.0)
Hemoglobin: 13.3 g/dL (ref 12.0–16.0)
Lymphocytes Relative: 5 %
Lymphs Abs: 0.6 10*3/uL — ABNORMAL LOW (ref 1.0–3.6)
MCH: 33.4 pg (ref 26.0–34.0)
MCHC: 33.5 g/dL (ref 32.0–36.0)
MCV: 99.6 fL (ref 80.0–100.0)
Monocytes Absolute: 0.5 10*3/uL (ref 0.2–0.9)
Monocytes Relative: 4 %
Neutro Abs: 10.5 10*3/uL — ABNORMAL HIGH (ref 1.4–6.5)
Neutrophils Relative %: 91 %
Platelets: 520 10*3/uL — ABNORMAL HIGH (ref 150–440)
RBC: 3.99 MIL/uL (ref 3.80–5.20)
RDW: 20.3 % — ABNORMAL HIGH (ref 11.5–14.5)
WBC: 11.7 10*3/uL — ABNORMAL HIGH (ref 3.6–11.0)

## 2015-01-26 LAB — COMPREHENSIVE METABOLIC PANEL
ALBUMIN: 4 g/dL (ref 3.5–5.0)
ALK PHOS: 63 U/L (ref 38–126)
ALT: 21 U/L (ref 14–54)
ANION GAP: 6 (ref 5–15)
AST: 29 U/L (ref 15–41)
BILIRUBIN TOTAL: 0.6 mg/dL (ref 0.3–1.2)
BUN: 20 mg/dL (ref 6–20)
CALCIUM: 9 mg/dL (ref 8.9–10.3)
CO2: 28 mmol/L (ref 22–32)
CREATININE: 0.97 mg/dL (ref 0.44–1.00)
Chloride: 101 mmol/L (ref 101–111)
GFR calc Af Amer: >60 mL/min (ref >60)
GFR calc non Af Amer: 55 mL/min — ABNORMAL LOW (ref >60)
GLUCOSE: 96 mg/dL (ref 65–99)
Potassium: 3.4 mmol/L — ABNORMAL LOW (ref 3.5–5.1)
Sodium: 135 mmol/L (ref 135–145)
TOTAL PROTEIN: 6.7 g/dL (ref 6.5–8.1)

## 2015-01-26 LAB — MAGNESIUM: Magnesium: 1.7 mg/dL (ref 1.7–2.4)

## 2015-01-26 NOTE — Progress Notes (Signed)
Patient reports feeling more unsteady on her feet.  Reports that her mouth is sore.  Knot of left lower calf, patient is concerned that it is a DVT, no redness or swelling at site

## 2015-01-27 ENCOUNTER — Encounter: Payer: Self-pay | Admitting: Oncology

## 2015-01-27 NOTE — Progress Notes (Signed)
t  [place] @ [facility] Telephone:([phone]) [phone]  Fax:([phone]) [place]: 06/30/1935  MR#: [id number]  UUV#:[id number]  Patient Care Team: Adin Hector, MD as PCP - General (Internal Medicine)  CHIEF COMPLAINT:  Chief Complaint  Patient presents with  . Lung Cancer    mouth sore, lower left leg knot    Oncology History   Non-small cell carcinoma of lung T1 N2 M1stage IV diseasePET scan is consistent with one small lesion in the T11 as well as suspected lesion in liver (diagnoses was in July of 2015) right upper lobe tumor with metastases to the left liver and mediastinal lymph node  MRI of liver conforms metastases to the liver.  MRI of thoracic spine raises possibility of metastases to the spine however this is inconclusive study.  (August, 2015) 2.  Patient was started on carboplatinum and Taxol started on November 25, 2013. 6 cycle of chemotherapy with carboplatinum and Taxol.  Last treatment was March 16, 2014 7.because of progressing disease on CT scan patient has been started on NIVO   in  February of 2016 8.  Patient is off NIVO because of diarrhea (June, 2016) 7.  Will be started on carboplatinum and gemcitabine from July, 19th, 2016   8.  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  Diagnosis by MRI scan of brain October of 2016 7.  Patient had stereotactic brain radiation therapy finished in October of 2016   Oncology Flowsheet 10/27/2014 11/03/2014 11/17/2014 11/24/2014 12/08/2014 12/29/2014 01/05/2015  Day, Cycle Day 1, Cycle 1 Day 8, Cycle 1 Day 1, Cycle 2 Day 8, Cycle 2 Day 1, Cycle 3 Day 1, Cycle 4 Day 8, Cycle 4  CARBOplatin (PARAPLATIN) IV 390 mg - 390 mg - 390 mg 390 mg -  dexamethasone (DECADRON) IV [ 12 mg ] - [ 12 mg ] - [ 12 mg ] [ 12 mg ] [ 10 mg ]  fosaprepitant (EMEND) IV [ 150 mg ] - [ 150 mg ] - [ 150 mg ] [ 150 mg ] -  gemcitabine (GEMZAR) IV 1,000 mg/m2 1,000 mg/m2 1,000 mg/m2 1,000 mg/m2 1,000 mg/m2 1,000 mg/m2 1,000 mg/m2  LORazepam  (ATIVAN) IV - - - - - 0.5 mg -  nivolumab (OPDIVO) IV - - - - - - -  ondansetron (ZOFRAN) IV - [ 8 mg ] [ 8 mg ] [ 8 mg ] - - [ 8 mg ]  palonosetron (ALOXI) IV 0.25 mg - 0.25 mg - 0.25 mg 0.25 mg -    INTERVAL HISTORY:  79 year old lady who has been diagnosed to have stage IV carcinoma off    Patient had a brain metastases recently diagnosed.  Had diplopia and dizziness which persists patient has finished stereotactic brain radiation therapy Continues to have ataxia as well as diplopia getting better patient is on steroid.  No nausea no vomiting.  Patient will was taken off NIVOLULAMAB because of diarrhea. Appetite has been improving.  No headache. Because of progressing disease and intolerance to [facility] patient was started on carboplatinum and gemcitabine which is on hold because of brain metastases.   REVIEW OF SYSTEMS:   GENERAL:  Feels good.  Active.  No fevers, sweats or weight loss. PERFORMANCE STATUS (ECOG):  01 HEENT:  No visual changes, runny nose, sore throat, mouth sores or tenderness. Lungs: No shortness of breath or cough.  No hemoptysis. Cardiac:  No chest pain, palpitations, orthopnea, or PND. GI: Diarrhea is improving Abdominal  discomfort.  No blood or mucus in the stool.  Watery stool 2 or 3 a day.  No chills.  No fever. GU:  No urgency, frequency, dysuria, or hematuria. Musculoskeletal:  No back pain.  No joint pain.  No muscle tenderness. Extremities:  No pain or swelling. Skin:  No rashes or skin changes. Neurological system as mentioned in history of present illness Endocrine:  No diabetes, thyroid issues, hot flashes or night sweats. Psych:  No mood changes, depression or anxiety. Pain:  No focal pain. Review of systems:  All other systems reviewed and found to be negative.  As per HPI. Otherwise, a complete review of systems is negatve.  PAST MEDICAL HISTORY: Past Medical History  Diagnosis Date  . Lung cancer ([place]) non small cell    PAST  SURGICAL HISTORY: No significant past surgical history other than mentioned below FAMILY HISTORY There is no significant family history of breast cancer, ovarian cancer, colon cancer Smoking History 0.5(1)60 years(1)  PFSH: Comments: family history of colon cancer cancer of uterus and history of hypertension and heart disease  Social History: positive tobacco  Smoker: Smoking cessation counseling performed  Smoker- Packs Per Day: 0.5  Smoker- How Many Years: 68-  Additional Past Medical and Surgical History: As mentioned above         ADVANCED DIRECTIVES: Patient does not have any advanced healthcare directive. Information has been given.   HEALTH MAINTENANCE: Social History  Substance Use Topics  . Smoking status: Current Every Day Smoker -- 0.50 packs/day for 60 years  . Smokeless tobacco: None  . Alcohol Use: None       Allergies  Allergen Reactions  . Codeine Other (See Comments) and Nausea Only    GI Distress  . Hydrochlorothiazide Other (See Comments)    hypercalcemia  . Prednisone Swelling      OBJECTIVE:  Filed Vitals:   01/26/15 1000  BP: 143/81  Pulse: 71  Temp: 97.9 F (36.6 C)     Body mass index is 30.97 kg/(m^2).    ECOG FS:1 - Symptomatic but completely ambulatory  PHYSICAL EXAM: Goal status: Performance status is good.  Patient has not lost significant weight HEENT: No evidence of stomatitis. Sclera and conjunctivae :: No jaundice.   pale looking. Lungs: Air  entry equal on both sides.  No rhonchi.  No rales.  Cardiac: Heart sounds are normal.  No pericardial rub.  No murmur. Lymphatic system: Cervical, axillary, inguinal, lymph nodes not palpable GI: Abdomen is soft.  No ascites.  Liver spleen not palpable.  No tenderness.  Bowel sounds are within normal limit Lower extremity: No edema Neurological system: Higher functions, involvement of seventh nerve .  Other localizing sign. Upper and lower extremity deep motor and sensory system within  normal limit Skin: No rash.  No ecchymosis.. NO clinical signs of dehydration LAB RESULTS:  Appointment on 01/26/2015  Component Date Value Ref Range Status  . WBC 01/26/2015 11.7* 3.6 - 11.0 K/uL Final  . RBC 01/26/2015 3.99  3.80 - 5.20 MIL/uL Final  . Hemoglobin 01/26/2015 13.3  12.0 - 16.0 g/dL Final  . HCT 01/26/2015 39.7  35.0 - 47.0 % Final  . MCV 01/26/2015 99.6  80.0 - 100.0 fL Final  . MCH 01/26/2015 33.4  26.0 - 34.0 pg Final  . MCHC 01/26/2015 33.5  32.0 - 36.0 g/dL Final  . RDW 01/26/2015 20.3* 11.5 - 14.5 % Final  . Platelets 01/26/2015 520* 150 - 440 K/uL Final  . Neutrophils  Relative % 01/26/2015 91   Final  . Neutro Abs 01/26/2015 10.5* 1.4 - 6.5 K/uL Final  . Lymphocytes Relative 01/26/2015 5   Final  . Lymphs Abs 01/26/2015 0.6* 1.0 - 3.6 K/uL Final  . Monocytes Relative 01/26/2015 4   Final  . Monocytes Absolute 01/26/2015 0.5  0.2 - 0.9 K/uL Final  . Eosinophils Relative 01/26/2015 0   Final  . Eosinophils Absolute 01/26/2015 0.0  0 - 0.7 K/uL Final  . Basophils Relative 01/26/2015 0   Final  . Basophils Absolute 01/26/2015 0.0  0 - 0.1 K/uL Final  . Sodium 01/26/2015 135  135 - 145 mmol/L Final  . Potassium 01/26/2015 3.4* 3.5 - 5.1 mmol/L Final  . Chloride 01/26/2015 101  101 - 111 mmol/L Final  . CO2 01/26/2015 28  22 - 32 mmol/L Final  . Glucose, Bld 01/26/2015 96  65 - 99 mg/dL Final  . BUN 01/26/2015 20  6 - 20 mg/dL Final  . Creatinine, Ser 01/26/2015 0.97  0.44 - 1.00 mg/dL Final  . Calcium 01/26/2015 9.0  8.9 - 10.3 mg/dL Final  . Total Protein 01/26/2015 6.7  6.5 - 8.1 g/dL Final  . Albumin 01/26/2015 4.0  3.5 - 5.0 g/dL Final  . AST 01/26/2015 29  15 - 41 U/L Final  . ALT 01/26/2015 21  14 - 54 U/L Final  . Alkaline Phosphatase 01/26/2015 63  38 - 126 U/L Final  . Total Bilirubin 01/26/2015 0.6  0.3 - 1.2 mg/dL Final  . GFR calc non Af Amer 01/26/2015 55* >60 mL/min Final  . GFR calc Af Amer 01/26/2015 >60  >60 mL/min Final   Comment:  (NOTE) The eGFR has been calculated using the CKD EPI equation. This calculation has not been validated in all clinical situations. eGFR's persistently <60 mL/min signify possible Chronic Kidney Disease.   . Anion gap 01/26/2015 6  5 - 15 Final  . Magnesium 01/26/2015 1.7  1.7 - 2.4 mg/dL Final      STUDIES: IMPRESSION: 1. 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 The fourth ventricle remains patent and there is no significant intracranial mass effect at this time. 2. No other metastatic disease identified. 3. 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  Electronically Signed: By: Genevie Ann M.D. On: 01/12/2015 16:14 ASSESSMENT: Carcinoma of lung, non-small cell type.  Stage IV patient was on NIVOLULAMAB NIVOLULAMAB was discontinued because of persistent diarrhea Patient has been started on carboplatinum and gemcitabine 2.  Metastases to the brain status post stereotactic brain radiation therapy  Symptoms of dizziness and diplopia still persist  Will continue steroid gradually tapering it off  Reevaluate patient in 2-3 weeks  After symptoms resolved so get better then consider starting patient back on carboplatinum and gemcitabine  Patient had some side effect of carboplatinum and will continue to need preparation prior to carboplatinum with prednisone Zantac  Patient is agreeable to it reevaluate patient in 3 weeks and if CNS symptoms are getting better and then proceed with systemic chemotherapy  Patient's family understood overall poor prognosis  Total duration of visit was 45 minutes.  50% or more time was spent in counseling patient and family regarding prognosis and options of treatment and available resources      Cancer of lung, upper lobe   Staging form: Lung, AJCC 7th Edition     Clinical: Stage IV (T1, N2, M1b) - Unsigned  Forest Gleason, MD   01/27/2015 8:25 AM

## 2015-02-09 ENCOUNTER — Inpatient Hospital Stay (HOSPITAL_BASED_OUTPATIENT_CLINIC_OR_DEPARTMENT_OTHER): Payer: Medicare Other | Admitting: Oncology

## 2015-02-09 ENCOUNTER — Inpatient Hospital Stay: Payer: Medicare Other | Attending: Oncology

## 2015-02-09 ENCOUNTER — Inpatient Hospital Stay: Payer: Medicare Other

## 2015-02-09 ENCOUNTER — Encounter: Payer: Self-pay | Admitting: Oncology

## 2015-02-09 VITALS — BP 166/82 | HR 75 | Temp 95.6°F | Wt 161.2 lb

## 2015-02-09 DIAGNOSIS — Z923 Personal history of irradiation: Secondary | ICD-10-CM

## 2015-02-09 DIAGNOSIS — Z803 Family history of malignant neoplasm of breast: Secondary | ICD-10-CM

## 2015-02-09 DIAGNOSIS — R600 Localized edema: Secondary | ICD-10-CM | POA: Diagnosis not present

## 2015-02-09 DIAGNOSIS — R109 Unspecified abdominal pain: Secondary | ICD-10-CM | POA: Insufficient documentation

## 2015-02-09 DIAGNOSIS — C771 Secondary and unspecified malignant neoplasm of intrathoracic lymph nodes: Secondary | ICD-10-CM

## 2015-02-09 DIAGNOSIS — Z5111 Encounter for antineoplastic chemotherapy: Secondary | ICD-10-CM | POA: Diagnosis not present

## 2015-02-09 DIAGNOSIS — R197 Diarrhea, unspecified: Secondary | ICD-10-CM | POA: Insufficient documentation

## 2015-02-09 DIAGNOSIS — F17210 Nicotine dependence, cigarettes, uncomplicated: Secondary | ICD-10-CM | POA: Insufficient documentation

## 2015-02-09 DIAGNOSIS — C787 Secondary malignant neoplasm of liver and intrahepatic bile duct: Secondary | ICD-10-CM | POA: Diagnosis not present

## 2015-02-09 DIAGNOSIS — Z79899 Other long term (current) drug therapy: Secondary | ICD-10-CM | POA: Insufficient documentation

## 2015-02-09 DIAGNOSIS — R630 Anorexia: Secondary | ICD-10-CM | POA: Insufficient documentation

## 2015-02-09 DIAGNOSIS — R1310 Dysphagia, unspecified: Secondary | ICD-10-CM | POA: Diagnosis not present

## 2015-02-09 DIAGNOSIS — C3412 Malignant neoplasm of upper lobe, left bronchus or lung: Secondary | ICD-10-CM | POA: Diagnosis present

## 2015-02-09 DIAGNOSIS — R110 Nausea: Secondary | ICD-10-CM | POA: Diagnosis not present

## 2015-02-09 DIAGNOSIS — H532 Diplopia: Secondary | ICD-10-CM | POA: Insufficient documentation

## 2015-02-09 DIAGNOSIS — D110 Benign neoplasm of parotid gland: Secondary | ICD-10-CM | POA: Insufficient documentation

## 2015-02-09 DIAGNOSIS — R634 Abnormal weight loss: Secondary | ICD-10-CM | POA: Insufficient documentation

## 2015-02-09 DIAGNOSIS — R51 Headache: Secondary | ICD-10-CM | POA: Diagnosis not present

## 2015-02-09 DIAGNOSIS — C7931 Secondary malignant neoplasm of brain: Secondary | ICD-10-CM | POA: Insufficient documentation

## 2015-02-09 DIAGNOSIS — M7989 Other specified soft tissue disorders: Secondary | ICD-10-CM | POA: Diagnosis not present

## 2015-02-09 DIAGNOSIS — R270 Ataxia, unspecified: Secondary | ICD-10-CM | POA: Insufficient documentation

## 2015-02-09 DIAGNOSIS — C3410 Malignant neoplasm of upper lobe, unspecified bronchus or lung: Secondary | ICD-10-CM

## 2015-02-09 LAB — CBC WITH DIFFERENTIAL/PLATELET
BASOS ABS: 0 10*3/uL (ref 0–0.1)
BASOS PCT: 0 %
EOS PCT: 0 %
Eosinophils Absolute: 0 10*3/uL (ref 0–0.7)
HCT: 42.4 % (ref 35.0–47.0)
Hemoglobin: 14.4 g/dL (ref 12.0–16.0)
LYMPHS PCT: 2 %
Lymphs Abs: 0.2 10*3/uL — ABNORMAL LOW (ref 1.0–3.6)
MCH: 34.4 pg — ABNORMAL HIGH (ref 26.0–34.0)
MCHC: 34 g/dL (ref 32.0–36.0)
MCV: 101.1 fL — AB (ref 80.0–100.0)
MONO ABS: 0.2 10*3/uL (ref 0.2–0.9)
Monocytes Relative: 2 %
Neutro Abs: 9.3 10*3/uL — ABNORMAL HIGH (ref 1.4–6.5)
Neutrophils Relative %: 96 %
PLATELETS: 196 10*3/uL (ref 150–440)
RBC: 4.19 MIL/uL (ref 3.80–5.20)
RDW: 18.2 % — AB (ref 11.5–14.5)
WBC: 9.7 10*3/uL (ref 3.6–11.0)

## 2015-02-09 LAB — COMPREHENSIVE METABOLIC PANEL
ALBUMIN: 4.1 g/dL (ref 3.5–5.0)
ALK PHOS: 55 U/L (ref 38–126)
ALT: 27 U/L (ref 14–54)
ANION GAP: 8 (ref 5–15)
AST: 38 U/L (ref 15–41)
BUN: 23 mg/dL — ABNORMAL HIGH (ref 6–20)
CALCIUM: 8.7 mg/dL — AB (ref 8.9–10.3)
CO2: 25 mmol/L (ref 22–32)
Chloride: 96 mmol/L — ABNORMAL LOW (ref 101–111)
Creatinine, Ser: 0.83 mg/dL (ref 0.44–1.00)
GFR calc non Af Amer: >60 mL/min (ref >60)
Glucose, Bld: 140 mg/dL — ABNORMAL HIGH (ref 65–99)
POTASSIUM: 3.7 mmol/L (ref 3.5–5.1)
SODIUM: 129 mmol/L — AB (ref 135–145)
Total Bilirubin: 0.7 mg/dL (ref 0.3–1.2)
Total Protein: 6.9 g/dL (ref 6.5–8.1)

## 2015-02-09 LAB — MAGNESIUM: Magnesium: 1.7 mg/dL (ref 1.7–2.4)

## 2015-02-09 MED ORDER — FAMOTIDINE IN NACL 20-0.9 MG/50ML-% IV SOLN
20.0000 mg | Freq: Once | INTRAVENOUS | Status: AC
  Administered 2015-02-09: 20 mg via INTRAVENOUS
  Filled 2015-02-09: qty 50

## 2015-02-09 MED ORDER — LORATADINE 10 MG PO TABS
10.0000 mg | ORAL_TABLET | Freq: Once | ORAL | Status: AC
  Administered 2015-02-09: 10 mg via ORAL
  Filled 2015-02-09: qty 1

## 2015-02-09 MED ORDER — GEMCITABINE HCL CHEMO INJECTION 1 GM/26.3ML
1000.0000 mg/m2 | Freq: Once | INTRAVENOUS | Status: AC
  Administered 2015-02-09: 1786 mg via INTRAVENOUS
  Filled 2015-02-09: qty 41.71

## 2015-02-09 MED ORDER — HEPARIN SOD (PORK) LOCK FLUSH 100 UNIT/ML IV SOLN
500.0000 [IU] | Freq: Once | INTRAVENOUS | Status: AC
  Administered 2015-02-09: 500 [IU] via INTRAVENOUS
  Filled 2015-02-09: qty 5

## 2015-02-09 MED ORDER — PALONOSETRON HCL INJECTION 0.25 MG/5ML
0.2500 mg | Freq: Once | INTRAVENOUS | Status: AC
  Administered 2015-02-09: 0.25 mg via INTRAVENOUS
  Filled 2015-02-09: qty 5

## 2015-02-09 MED ORDER — SODIUM CHLORIDE 0.9 % IV SOLN
390.0000 mg | Freq: Once | INTRAVENOUS | Status: AC
  Administered 2015-02-09: 390 mg via INTRAVENOUS
  Filled 2015-02-09: qty 39

## 2015-02-09 MED ORDER — DIPHENHYDRAMINE HCL 50 MG/ML IJ SOLN
50.0000 mg | Freq: Once | INTRAMUSCULAR | Status: AC
  Administered 2015-02-09: 50 mg via INTRAVENOUS
  Filled 2015-02-09: qty 1

## 2015-02-09 MED ORDER — SODIUM CHLORIDE 0.9 % IV SOLN
Freq: Once | INTRAVENOUS | Status: AC
Start: 2015-02-09 — End: 2015-02-09
  Administered 2015-02-09: 12:00:00 via INTRAVENOUS
  Filled 2015-02-09: qty 1000

## 2015-02-09 MED ORDER — ALBUTEROL SULFATE HFA 108 (90 BASE) MCG/ACT IN AERS: 2.0000 | INHALATION_SPRAY | Freq: Four times a day (QID) | RESPIRATORY_TRACT | Status: AC | PRN

## 2015-02-09 MED ORDER — SODIUM CHLORIDE 0.9 % IV SOLN
Freq: Once | INTRAVENOUS | Status: AC
  Administered 2015-02-09: 12:00:00 via INTRAVENOUS
  Filled 2015-02-09: qty 5

## 2015-02-09 MED ORDER — SODIUM CHLORIDE 0.9 % IJ SOLN
10.0000 mL | INTRAMUSCULAR | Status: DC | PRN
  Administered 2015-02-09: 10 mL via INTRAVENOUS
  Filled 2015-02-09: qty 10

## 2015-02-09 NOTE — Progress Notes (Signed)
Patient is having double vision and dizziness when walking.  Requesting refill for ProAir.

## 2015-02-09 NOTE — Progress Notes (Signed)
Slight redness present on patients palm of hands and fingertips bilaterally. Patient states, "They do not itch or burn." MD, Dr. Oliva Bustard, notified and aware. Per MD, Dr. Oliva Bustard, order: patient may be discharged to home. Patient instructed to take Benadryl as needed as directed.

## 2015-02-13 ENCOUNTER — Encounter: Payer: Self-pay | Admitting: Oncology

## 2015-02-13 NOTE — Progress Notes (Signed)
t  [place] @ [facility] Telephone:(336) [phone]  Fax:(336) [place]: 09/04/1935  MR#: [id number]  WKG#:[id number]  Patient Care Team: Adin Hector, MD as PCP - General (Internal Medicine)  CHIEF COMPLAINT:  Chief Complaint  Patient presents with  . OTHER    Oncology History   Non-small cell carcinoma of lung T1 N2 M1stage IV diseasePET scan is consistent with one small lesion in the T11 as well as suspected lesion in liver (diagnoses was in July of 2015) right upper lobe tumor with metastases to the left liver and mediastinal lymph node  MRI of liver conforms metastases to the liver.  MRI of thoracic spine raises possibility of metastases to the spine however this is inconclusive study.  (August, 2015) 2.  Patient was started on carboplatinum and Taxol started on November 25, 2013. 6 cycle of chemotherapy with carboplatinum and Taxol.  Last treatment was March 16, 2014 7.because of progressing disease on CT scan patient has been started on NIVO   in  February of 2016 8.  Patient is off NIVO because of diarrhea (June, 2016) 7.  Will be started on carboplatinum and gemcitabine from July, 19th, 2016   8.  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  Diagnosis by MRI scan of brain October of 2016 7.  Patient had stereotactic brain radiation therapy finished in October of 2016   Oncology Flowsheet 11/03/2014 11/17/2014 11/24/2014 12/08/2014 12/29/2014 01/05/2015 02/09/2015  Day, Cycle Day 8, Cycle 1 Day 1, Cycle 2 Day 8, Cycle 2 Day 1, Cycle 3 Day 1, Cycle 4 Day 8, Cycle 4 Day 1, Cycle 5  CARBOplatin (PARAPLATIN) IV - 390 mg - 390 mg 390 mg - 390 mg  dexamethasone (DECADRON) IV - [ 12 mg ] - [ 12 mg ] [ 12 mg ] [ 10 mg ] [ 12 mg ]  fosaprepitant (EMEND) IV - [ 150 mg ] - [ 150 mg ] [ 150 mg ] - [ 150 mg ]  gemcitabine (GEMZAR) IV 1,000 mg/m2 1,000 mg/m2 1,000 mg/m2 1,000 mg/m2 1,000 mg/m2 1,000 mg/m2 1,000 mg/m2  LORazepam (ATIVAN) IV - - - - 0.5 mg - -  nivolumab  (OPDIVO) IV - - - - - - -  ondansetron (ZOFRAN) IV [ 8 mg ] [ 8 mg ] [ 8 mg ] - - [ 8 mg ] -  palonosetron (ALOXI) IV - 0.25 mg - 0.25 mg 0.25 mg - 0.25 mg    INTERVAL HISTORY:  79 year old lady who has been diagnosed to have stage IV carcinoma off   Patient continues to have diplopia but has improved significantly.  Ataxia is improved.  Headache is getting better.  Patient is gradually getting off Decadron.  No nausea no vomiting appetite is improved patient is here to initiate chemotherapy with carboplatinum and gemcitabine Continues to have some side effect of carboplatinum so medication with steroid prednisone was given prior to chemotherapy.    REVIEW OF SYSTEMS:   GENERAL:  Feels good.  Active.  No fevers, sweats or weight loss. PERFORMANCE STATUS (ECOG):  01 HEENT:  No visual changes, runny nose, sore throat, mouth sores or tenderness. Lungs: No shortness of breath or cough.  No hemoptysis. Cardiac:  No chest pain, palpitations, orthopnea, or PND. GI: Diarrhea is improving Abdominal discomfort.  No blood or mucus in the stool.  Watery stool 2 or 3 a day.  No chills.  No fever. GU:  No urgency, frequency, dysuria,  or hematuria. Musculoskeletal:  No back pain.  No joint pain.  No muscle tenderness. Extremities:  No pain or swelling. Skin:  No rashes or skin changes. Neurological system as mentioned in history of present illness Endocrine:  No diabetes, thyroid issues, hot flashes or night sweats. Psych:  No mood changes, depression or anxiety. Pain:  No focal pain. Review of systems:  All other systems reviewed and found to be negative.  As per HPI. Otherwise, a complete review of systems is negatve.  PAST MEDICAL HISTORY: Past Medical History  Diagnosis Date  . Lung cancer ([place]) non small cell    PAST SURGICAL HISTORY: No significant past surgical history other than mentioned below FAMILY HISTORY There is no significant family history of breast cancer, ovarian cancer,  colon cancer Smoking History 0.5(1)60 years(1)  PFSH: Comments: family history of colon cancer cancer of uterus and history of hypertension and heart disease  Social History: positive tobacco  Smoker: Smoking cessation counseling performed  Smoker- Packs Per Day: 0.5  Smoker- How Many Years: 53-  Additional Past Medical and Surgical History: As mentioned above         ADVANCED DIRECTIVES: Patient does not have any advanced healthcare directive. Information has been given.   HEALTH MAINTENANCE: Social History  Substance Use Topics  . Smoking status: Current Every Day Smoker -- 0.50 packs/day for 60 years  . Smokeless tobacco: None  . Alcohol Use: None       Allergies  Allergen Reactions  . Codeine Other (See Comments) and Nausea Only    GI Distress  . Hydrochlorothiazide Other (See Comments)    hypercalcemia  . Prednisone Swelling      OBJECTIVE:  Filed Vitals:   02/09/15 1053  BP: 166/82  Pulse: 75  Temp: 95.6 F (35.3 C)     Body mass index is 30.47 kg/(m^2).    ECOG FS:1 - Symptomatic but completely ambulatory  PHYSICAL EXAM: Goal status: Performance status is good.  Patient has not lost significant weight HEENT: No evidence of stomatitis. Sclera and conjunctivae :: No jaundice.   pale looking. Lungs: Air  entry equal on both sides.  No rhonchi.  No rales.  Cardiac: Heart sounds are normal.  No pericardial rub.  No murmur. Lymphatic system: Cervical, axillary, inguinal, lymph nodes not palpable GI: Abdomen is soft.  No ascites.  Liver spleen not palpable.  No tenderness.  Bowel sounds are within normal limit Lower extremity: No edema Neurological system: : Diplopia has improved.  Cranial nerves are improving.  Ataxia is improving.  No other localizing symptom Upper and lower extremity deep motor and sensory system within normal limit Skin: No rash.  No ecchymosis.. NO clinical signs of dehydration LAB RESULTS:  Infusion on 02/09/2015  Component Date  Value Ref Range Status  . WBC 02/09/2015 9.7  3.6 - 11.0 K/uL Final   A-LINE DRAW  . RBC 02/09/2015 4.19  3.80 - 5.20 MIL/uL Final  . Hemoglobin 02/09/2015 14.4  12.0 - 16.0 g/dL Final  . HCT 02/09/2015 42.4  35.0 - 47.0 % Final  . MCV 02/09/2015 101.1* 80.0 - 100.0 fL Final  . MCH 02/09/2015 34.4* 26.0 - 34.0 pg Final  . MCHC 02/09/2015 34.0  32.0 - 36.0 g/dL Final  . RDW 02/09/2015 18.2* 11.5 - 14.5 % Final  . Platelets 02/09/2015 196  150 - 440 K/uL Final  . Neutrophils Relative % 02/09/2015 96   Final  . Neutro Abs 02/09/2015 9.3* 1.4 - 6.5 K/uL Final  .  Lymphocytes Relative 02/09/2015 2   Final  . Lymphs Abs 02/09/2015 0.2* 1.0 - 3.6 K/uL Final  . Monocytes Relative 02/09/2015 2   Final  . Monocytes Absolute 02/09/2015 0.2  0.2 - 0.9 K/uL Final  . Eosinophils Relative 02/09/2015 0   Final  . Eosinophils Absolute 02/09/2015 0.0  0 - 0.7 K/uL Final  . Basophils Relative 02/09/2015 0   Final  . Basophils Absolute 02/09/2015 0.0  0 - 0.1 K/uL Final  . Sodium 02/09/2015 129* 135 - 145 mmol/L Final  . Potassium 02/09/2015 3.7  3.5 - 5.1 mmol/L Final  . Chloride 02/09/2015 96* 101 - 111 mmol/L Final  . CO2 02/09/2015 25  22 - 32 mmol/L Final  . Glucose, Bld 02/09/2015 140* 65 - 99 mg/dL Final  . BUN 02/09/2015 23* 6 - 20 mg/dL Final  . Creatinine, Ser 02/09/2015 0.83  0.44 - 1.00 mg/dL Final  . Calcium 02/09/2015 8.7* 8.9 - 10.3 mg/dL Final  . Total Protein 02/09/2015 6.9  6.5 - 8.1 g/dL Final  . Albumin 02/09/2015 4.1  3.5 - 5.0 g/dL Final  . AST 02/09/2015 38  15 - 41 U/L Final  . ALT 02/09/2015 27  14 - 54 U/L Final  . Alkaline Phosphatase 02/09/2015 55  38 - 126 U/L Final  . Total Bilirubin 02/09/2015 0.7  0.3 - 1.2 mg/dL Final  . GFR calc non Af Amer 02/09/2015 >60  >60 mL/min Final  . GFR calc Af Amer 02/09/2015 >60  >60 mL/min Final   Comment: (NOTE) The eGFR has been calculated using the CKD EPI equation. This calculation has not been validated in all clinical  situations. eGFR's persistently <60 mL/min signify possible Chronic Kidney Disease.   . Anion gap 02/09/2015 8  5 - 15 Final  . Magnesium 02/09/2015 1.7  1.7 - 2.4 mg/dL Final      STUDIES: IMPRESSION: 1. 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 The fourth ventricle remains patent and there is no significant intracranial mass effect at this time. 2. No other metastatic disease identified. 3. 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  Electronically Signed: By: Genevie Ann M.D. 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 Day 8 gemcitabine would be given. Total duration of visit was 45 minutes.  50% or more time was spent in counseling patient and family regarding prognosis and options of treatment and available resources      Cancer of lung, upper lobe   Staging form: Lung, AJCC 7th Edition     Clinical: Stage IV (T1, N2, M1b) - Marni Griffon, MD   02/13/2015 9:37 AM

## 2015-02-14 ENCOUNTER — Inpatient Hospital Stay (HOSPITAL_BASED_OUTPATIENT_CLINIC_OR_DEPARTMENT_OTHER): Payer: Medicare Other | Admitting: Oncology

## 2015-02-14 ENCOUNTER — Inpatient Hospital Stay: Payer: Medicare Other

## 2015-02-14 ENCOUNTER — Telehealth: Payer: Self-pay | Admitting: *Deleted

## 2015-02-14 VITALS — BP 142/84 | HR 65 | Temp 97.1°F | Wt 164.0 lb

## 2015-02-14 DIAGNOSIS — M7989 Other specified soft tissue disorders: Secondary | ICD-10-CM

## 2015-02-14 DIAGNOSIS — D110 Benign neoplasm of parotid gland: Secondary | ICD-10-CM

## 2015-02-14 DIAGNOSIS — C771 Secondary and unspecified malignant neoplasm of intrathoracic lymph nodes: Secondary | ICD-10-CM

## 2015-02-14 DIAGNOSIS — C787 Secondary malignant neoplasm of liver and intrahepatic bile duct: Secondary | ICD-10-CM

## 2015-02-14 DIAGNOSIS — R51 Headache: Secondary | ICD-10-CM

## 2015-02-14 DIAGNOSIS — R197 Diarrhea, unspecified: Secondary | ICD-10-CM

## 2015-02-14 DIAGNOSIS — F17210 Nicotine dependence, cigarettes, uncomplicated: Secondary | ICD-10-CM

## 2015-02-14 DIAGNOSIS — C7931 Secondary malignant neoplasm of brain: Secondary | ICD-10-CM

## 2015-02-14 DIAGNOSIS — C3412 Malignant neoplasm of upper lobe, left bronchus or lung: Secondary | ICD-10-CM | POA: Diagnosis not present

## 2015-02-14 DIAGNOSIS — R601 Generalized edema: Secondary | ICD-10-CM

## 2015-02-14 DIAGNOSIS — R270 Ataxia, unspecified: Secondary | ICD-10-CM

## 2015-02-14 DIAGNOSIS — Z923 Personal history of irradiation: Secondary | ICD-10-CM

## 2015-02-14 DIAGNOSIS — C3410 Malignant neoplasm of upper lobe, unspecified bronchus or lung: Secondary | ICD-10-CM

## 2015-02-14 DIAGNOSIS — H532 Diplopia: Secondary | ICD-10-CM

## 2015-02-14 LAB — CBC WITH DIFFERENTIAL/PLATELET
BASOS ABS: 0 10*3/uL (ref 0–0.1)
BASOS PCT: 0 %
Eosinophils Absolute: 0 10*3/uL (ref 0–0.7)
Eosinophils Relative: 1 %
HEMATOCRIT: 41.1 % (ref 35.0–47.0)
Hemoglobin: 13.9 g/dL (ref 12.0–16.0)
LYMPHS PCT: 18 %
Lymphs Abs: 1.3 10*3/uL (ref 1.0–3.6)
MCH: 34.3 pg — ABNORMAL HIGH (ref 26.0–34.0)
MCHC: 33.8 g/dL (ref 32.0–36.0)
MCV: 101.5 fL — AB (ref 80.0–100.0)
MONO ABS: 0.1 10*3/uL — AB (ref 0.2–0.9)
Monocytes Relative: 1 %
NEUTROS ABS: 5.9 10*3/uL (ref 1.4–6.5)
Neutrophils Relative %: 80 %
Platelets: 117 10*3/uL — ABNORMAL LOW (ref 150–440)
RBC: 4.05 MIL/uL (ref 3.80–5.20)
RDW: 16.7 % — AB (ref 11.5–14.5)
WBC: 7.3 10*3/uL (ref 3.6–11.0)

## 2015-02-14 LAB — COMPREHENSIVE METABOLIC PANEL
ALBUMIN: 4 g/dL (ref 3.5–5.0)
ALT: 31 U/L (ref 14–54)
ANION GAP: 6 (ref 5–15)
AST: 34 U/L (ref 15–41)
Alkaline Phosphatase: 64 U/L (ref 38–126)
BILIRUBIN TOTAL: 0.4 mg/dL (ref 0.3–1.2)
BUN: 20 mg/dL (ref 6–20)
CO2: 32 mmol/L (ref 22–32)
Calcium: 9.2 mg/dL (ref 8.9–10.3)
Chloride: 92 mmol/L — ABNORMAL LOW (ref 101–111)
Creatinine, Ser: 0.9 mg/dL (ref 0.44–1.00)
GFR calc Af Amer: >60 mL/min (ref >60)
Glucose, Bld: 116 mg/dL — ABNORMAL HIGH (ref 65–99)
POTASSIUM: 3.2 mmol/L — AB (ref 3.5–5.1)
Sodium: 130 mmol/L — ABNORMAL LOW (ref 135–145)
TOTAL PROTEIN: 6.6 g/dL (ref 6.5–8.1)

## 2015-02-14 MED ORDER — POTASSIUM CHLORIDE CRYS ER 20 MEQ PO TBCR: 20.0000 meq | EXTENDED_RELEASE_TABLET | Freq: Every day | ORAL | Status: DC

## 2015-02-14 MED ORDER — FUROSEMIDE 20 MG PO TABS: 20.0000 mg | ORAL_TABLET | Freq: Every day | ORAL | Status: DC

## 2015-02-14 NOTE — Telephone Encounter (Signed)
Patient agrees to come in this afternoon at 330

## 2015-02-14 NOTE — Progress Notes (Signed)
Patient complains of swelling yesterday.  States her face was round and her ankles were over her shoes.  States she was 10# heaver yesterday than on Saturday but lost 5# since yesterday.  She states left leg was more swollen than right.  No SOB .

## 2015-02-14 NOTE — Telephone Encounter (Signed)
Called to report that she has swelling all over her body. She is on steroids and is asking if anything can be done about it.

## 2015-02-16 ENCOUNTER — Inpatient Hospital Stay: Payer: Medicare Other

## 2015-02-16 ENCOUNTER — Inpatient Hospital Stay (HOSPITAL_BASED_OUTPATIENT_CLINIC_OR_DEPARTMENT_OTHER): Payer: Medicare Other | Admitting: Oncology

## 2015-02-16 ENCOUNTER — Telehealth: Payer: Self-pay

## 2015-02-16 VITALS — BP 121/72 | HR 107 | Temp 96.8°F | Wt 159.8 lb

## 2015-02-16 DIAGNOSIS — R634 Abnormal weight loss: Secondary | ICD-10-CM

## 2015-02-16 DIAGNOSIS — R197 Diarrhea, unspecified: Secondary | ICD-10-CM

## 2015-02-16 DIAGNOSIS — M7989 Other specified soft tissue disorders: Secondary | ICD-10-CM

## 2015-02-16 DIAGNOSIS — R270 Ataxia, unspecified: Secondary | ICD-10-CM

## 2015-02-16 DIAGNOSIS — Z923 Personal history of irradiation: Secondary | ICD-10-CM

## 2015-02-16 DIAGNOSIS — C787 Secondary malignant neoplasm of liver and intrahepatic bile duct: Secondary | ICD-10-CM

## 2015-02-16 DIAGNOSIS — C3412 Malignant neoplasm of upper lobe, left bronchus or lung: Secondary | ICD-10-CM

## 2015-02-16 DIAGNOSIS — C3410 Malignant neoplasm of upper lobe, unspecified bronchus or lung: Secondary | ICD-10-CM

## 2015-02-16 DIAGNOSIS — R109 Unspecified abdominal pain: Secondary | ICD-10-CM

## 2015-02-16 DIAGNOSIS — R600 Localized edema: Secondary | ICD-10-CM

## 2015-02-16 DIAGNOSIS — C7931 Secondary malignant neoplasm of brain: Secondary | ICD-10-CM | POA: Diagnosis not present

## 2015-02-16 DIAGNOSIS — F17210 Nicotine dependence, cigarettes, uncomplicated: Secondary | ICD-10-CM

## 2015-02-16 DIAGNOSIS — C771 Secondary and unspecified malignant neoplasm of intrathoracic lymph nodes: Secondary | ICD-10-CM | POA: Diagnosis not present

## 2015-02-16 LAB — CBC WITH DIFFERENTIAL/PLATELET
BASOS PCT: 0 %
Basophils Absolute: 0 10*3/uL (ref 0–0.1)
Eosinophils Absolute: 0.1 10*3/uL (ref 0–0.7)
Eosinophils Relative: 3 %
HCT: 39.2 % (ref 35.0–47.0)
Hemoglobin: 13.4 g/dL (ref 12.0–16.0)
Lymphocytes Relative: 28 %
Lymphs Abs: 1.3 10*3/uL (ref 1.0–3.6)
MCH: 34.1 pg — AB (ref 26.0–34.0)
MCHC: 34 g/dL (ref 32.0–36.0)
MCV: 100.3 fL — AB (ref 80.0–100.0)
MONO ABS: 0.2 10*3/uL (ref 0.2–0.9)
MONOS PCT: 4 %
NEUTROS ABS: 3 10*3/uL (ref 1.4–6.5)
Neutrophils Relative %: 65 %
PLATELETS: 69 10*3/uL — AB (ref 150–440)
RBC: 3.91 MIL/uL (ref 3.80–5.20)
RDW: 16.2 % — ABNORMAL HIGH (ref 11.5–14.5)
WBC: 4.6 10*3/uL (ref 3.6–11.0)

## 2015-02-16 MED ORDER — SODIUM CHLORIDE 0.9 % IV SOLN
Freq: Once | INTRAVENOUS | Status: AC
  Administered 2015-02-16: 15:00:00 via INTRAVENOUS
  Filled 2015-02-16: qty 1000

## 2015-02-16 MED ORDER — SODIUM CHLORIDE 0.9 % IV SOLN
Freq: Once | INTRAVENOUS | Status: AC
  Administered 2015-02-16: 15:00:00 via INTRAVENOUS
  Filled 2015-02-16: qty 4

## 2015-02-16 MED ORDER — SODIUM CHLORIDE 0.9 % IV SOLN
1000.0000 mg/m2 | Freq: Once | INTRAVENOUS | Status: DC
  Filled 2015-02-16: qty 47

## 2015-02-16 MED ORDER — SODIUM CHLORIDE 0.9 % IJ SOLN
10.0000 mL | INTRAMUSCULAR | Status: DC | PRN
  Administered 2015-02-16: 10 mL
  Filled 2015-02-16: qty 10

## 2015-02-16 MED ORDER — HEPARIN SOD (PORK) LOCK FLUSH 100 UNIT/ML IV SOLN
500.0000 [IU] | Freq: Once | INTRAVENOUS | Status: AC | PRN
  Administered 2015-02-16: 500 [IU]
  Filled 2015-02-16: qty 5

## 2015-02-16 MED ORDER — GEMCITABINE HCL CHEMO INJECTION 1 GM/26.3ML
1000.0000 mg | Freq: Once | INTRAVENOUS | Status: AC
  Administered 2015-02-16: 988.5932 mg via INTRAVENOUS
  Filled 2015-02-16: qty 26

## 2015-02-16 MED ORDER — DEXAMETHASONE 4 MG PO TABS: ORAL_TABLET | ORAL | Status: DC

## 2015-02-16 MED ORDER — GEMCITABINE HCL CHEMO INJECTION 1 GM/26.3ML: 1000.0000 mg | Freq: Once | INTRAVENOUS | Status: DC

## 2015-02-16 NOTE — Progress Notes (Signed)
Patient is down 5# since Monday.  States she is feeling much better.  Legs are much less edematous.

## 2015-02-16 NOTE — Progress Notes (Signed)
Platelets: 69,000. MD, Dr. Oliva Bustard, notified via telephone. Per MD, Dr. Oliva Bustard, order: proceed with chemotherapy treatment at this time.

## 2015-02-16 NOTE — Telephone Encounter (Signed)
Platelets =69 today.  Dr. Oliva Bustard wants to proceed with treatment today with gemzar.

## 2015-02-20 ENCOUNTER — Encounter: Payer: Self-pay | Admitting: Oncology

## 2015-02-20 NOTE — Progress Notes (Signed)
t  [place] @ [facility] Telephone:(336) [phone]  Fax:(336) [place]: 19-Sep-1935  MR#: [id number]  SJG#:[id number]  Patient Care Team: Adin Hector, MD as PCP - General (Internal Medicine)  CHIEF COMPLAINT:  Chief Complaint  Patient presents with  . OTHER    Oncology History   Non-small cell carcinoma of lung T1 N2 M1stage IV diseasePET scan is consistent with one small lesion in the T11 as well as suspected lesion in liver (diagnoses was in July of 2015) right upper lobe tumor with metastases to the left liver and mediastinal lymph node  MRI of liver conforms metastases to the liver.  MRI of thoracic spine raises possibility of metastases to the spine however this is inconclusive study.  (August, 2015) 2.  Patient was started on carboplatinum and Taxol started on November 25, 2013. 6 cycle of chemotherapy with carboplatinum and Taxol.  Last treatment was March 16, 2014 7.because of progressing disease on CT scan patient has been started on NIVO   in  February of 2016 8.  Patient is off NIVO because of diarrhea (June, 2016) 7.  Will be started on carboplatinum and gemcitabine from July, 19th, 2016   8.  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  Diagnosis by MRI scan of brain October of 2016 7.  Patient had stereotactic brain radiation therapy finished in October of 2016   Oncology Flowsheet 11/17/2014 11/24/2014 12/08/2014 12/29/2014 01/05/2015 02/09/2015 02/16/2015  Day, Cycle Day 1, Cycle 2 Day 8, Cycle 2 Day 1, Cycle 3 Day 1, Cycle 4 Day 8, Cycle 4 Day 1, Cycle 5 Day 8, Cycle 5  CARBOplatin (PARAPLATIN) IV 390 mg - 390 mg 390 mg - 390 mg -  dexamethasone (DECADRON) IV [ 12 mg ] - [ 12 mg ] [ 12 mg ] [ 10 mg ] [ 12 mg ] [ 10 mg ]  fosaprepitant (EMEND) IV [ 150 mg ] - [ 150 mg ] [ 150 mg ] - [ 150 mg ] -  gemcitabine (GEMZAR) IV 1,000 mg/m2 1,000 mg/m2 1,000 mg/m2 1,000 mg/m2 1,000 mg/m2 1,000 mg/m2 988.5932 mg  LORazepam (ATIVAN) IV - - - 0.5 mg - - -    nivolumab (OPDIVO) IV - - - - - - -  ondansetron (ZOFRAN) IV [ 8 mg ] [ 8 mg ] - - [ 8 mg ] - [ 8 mg ]  palonosetron (ALOXI) IV 0.25 mg - 0.25 mg 0.25 mg - 0.25 mg -    INTERVAL HISTORY:  79 year old lady who has been diagnosed to have stage IV carcinoma off   Patient continues to have diplopia but has improved significantly.  Ataxia is improved.  Headache is getting better.  Patient is gradually getting off Decadron.  No nausea no vomiting appetite is improved patient is here to initiate chemotherapy with carboplatinum and gemcitabine Patient continues to have problem with carboplatinum and generalized swelling. So carboplatinum is going to be discontinued. Platelet count is low-dose sotalol dose of gemcitabine has been decreased    REVIEW OF SYSTEMS:   GENERAL:  Feels good.  Active.  No fevers, sweats or weight loss. PERFORMANCE STATUS (ECOG):  01 HEENT:  No visual changes, runny nose, sore throat, mouth sores or tenderness. Lungs: No shortness of breath or cough.  No hemoptysis. Cardiac:  No chest pain, palpitations, orthopnea, or PND. GI: Diarrhea is improving Abdominal discomfort.  No blood or mucus in the stool.  Watery stool 2 or 3  a day.  No chills.  No fever. GU:  No urgency, frequency, dysuria, or hematuria. Musculoskeletal:  No back pain.  No joint pain.  No muscle tenderness. Extremities:  No pain or swelling. Skin:  No rashes or skin changes. Neurological system as mentioned in history of present illness Endocrine:  No diabetes, thyroid issues, hot flashes or night sweats. Psych:  No mood changes, depression or anxiety. Pain:  No focal pain. Review of systems:  All other systems reviewed and found to be negative.  As per HPI. Otherwise, a complete review of systems is negatve.  PAST MEDICAL HISTORY: Past Medical History  Diagnosis Date  . Lung cancer ([place]) non small cell    PAST SURGICAL HISTORY: No significant past surgical history other than mentioned  below FAMILY HISTORY There is no significant family history of breast cancer, ovarian cancer, colon cancer Smoking History 0.5(1)60 years(1)  PFSH: Comments: family history of colon cancer cancer of uterus and history of hypertension and heart disease  Social History: positive tobacco  Smoker: Smoking cessation counseling performed  Smoker- Packs Per Day: 0.5  Smoker- How Many Years: 38-  Additional Past Medical and Surgical History: As mentioned above         ADVANCED DIRECTIVES: Patient does not have any advanced healthcare directive. Information has been given.   HEALTH MAINTENANCE: Social History  Substance Use Topics  . Smoking status: Current Every Day Smoker -- 0.50 packs/day for 60 years  . Smokeless tobacco: None  . Alcohol Use: None       Allergies  Allergen Reactions  . Codeine Other (See Comments) and Nausea Only    GI Distress  . Hydrochlorothiazide Other (See Comments)    hypercalcemia  . Prednisone Swelling      OBJECTIVE:  Filed Vitals:   02/16/15 1432  BP: 121/72  Pulse: 107  Temp: 96.8 F (36 C)     Body mass index is 30.22 kg/(m^2).    ECOG FS:1 - Symptomatic but completely ambulatory  PHYSICAL EXAM: Goal status: Performance status is good.  Patient has not lost significant weight HEENT: No evidence of stomatitis. Sclera and conjunctivae :: No jaundice.   pale looking. Lungs: Air  entry equal on both sides.  No rhonchi.  No rales.  Cardiac: Heart sounds are normal.  No pericardial rub.  No murmur. Lymphatic system: Cervical, axillary, inguinal, lymph nodes not palpable GI: Abdomen is soft.  No ascites.  Liver spleen not palpable.  No tenderness.  Bowel sounds are within normal limit Lower extremity: No edema Neurological system: : Diplopia has improved.  Cranial nerves are improving.  Ataxia is improving.  No other localizing symptom Upper and lower extremity deep motor and sensory system within normal limit Skin: No rash.  No  ecchymosis.. NO clinical signs of dehydration LAB RESULTS:  Infusion on 02/16/2015  Component Date Value Ref Range Status  . WBC 02/16/2015 4.6  3.6 - 11.0 K/uL Final   A-LINE DRAW  . RBC 02/16/2015 3.91  3.80 - 5.20 MIL/uL Final  . Hemoglobin 02/16/2015 13.4  12.0 - 16.0 g/dL Final  . HCT 02/16/2015 39.2  35.0 - 47.0 % Final  . MCV 02/16/2015 100.3* 80.0 - 100.0 fL Final  . MCH 02/16/2015 34.1* 26.0 - 34.0 pg Final  . MCHC 02/16/2015 34.0  32.0 - 36.0 g/dL Final  . RDW 02/16/2015 16.2* 11.5 - 14.5 % Final  . Platelets 02/16/2015 69* 150 - 440 K/uL Final  . Neutrophils Relative % 02/16/2015 65  Final  . Neutro Abs 02/16/2015 3.0  1.4 - 6.5 K/uL Final  . Lymphocytes Relative 02/16/2015 28   Final  . Lymphs Abs 02/16/2015 1.3  1.0 - 3.6 K/uL Final  . Monocytes Relative 02/16/2015 4   Final  . Monocytes Absolute 02/16/2015 0.2  0.2 - 0.9 K/uL Final  . Eosinophils Relative 02/16/2015 3   Final  . Eosinophils Absolute 02/16/2015 0.1  0 - 0.7 K/uL Final  . Basophils Relative 02/16/2015 0   Final  . Basophils Absolute 02/16/2015 0.0  0 - 0.1 K/uL Final  Appointment on 02/14/2015  Component Date Value Ref Range Status  . WBC 02/14/2015 7.3  3.6 - 11.0 K/uL Final  . RBC 02/14/2015 4.05  3.80 - 5.20 MIL/uL Final  . Hemoglobin 02/14/2015 13.9  12.0 - 16.0 g/dL Final  . HCT 02/14/2015 41.1  35.0 - 47.0 % Final  . MCV 02/14/2015 101.5* 80.0 - 100.0 fL Final  . MCH 02/14/2015 34.3* 26.0 - 34.0 pg Final  . MCHC 02/14/2015 33.8  32.0 - 36.0 g/dL Final  . RDW 02/14/2015 16.7* 11.5 - 14.5 % Final  . Platelets 02/14/2015 117* 150 - 440 K/uL Final  . Neutrophils Relative % 02/14/2015 80   Final  . Neutro Abs 02/14/2015 5.9  1.4 - 6.5 K/uL Final  . Lymphocytes Relative 02/14/2015 18   Final  . Lymphs Abs 02/14/2015 1.3  1.0 - 3.6 K/uL Final  . Monocytes Relative 02/14/2015 1   Final  . Monocytes Absolute 02/14/2015 0.1* 0.2 - 0.9 K/uL Final  . Eosinophils Relative 02/14/2015 1   Final  .  Eosinophils Absolute 02/14/2015 0.0  0 - 0.7 K/uL Final  . Basophils Relative 02/14/2015 0   Final  . Basophils Absolute 02/14/2015 0.0  0 - 0.1 K/uL Final  . Sodium 02/14/2015 130* 135 - 145 mmol/L Final  . Potassium 02/14/2015 3.2* 3.5 - 5.1 mmol/L Final  . Chloride 02/14/2015 92* 101 - 111 mmol/L Final  . CO2 02/14/2015 32  22 - 32 mmol/L Final  . Glucose, Bld 02/14/2015 116* 65 - 99 mg/dL Final  . BUN 02/14/2015 20  6 - 20 mg/dL Final  . Creatinine, Ser 02/14/2015 0.90  0.44 - 1.00 mg/dL Final  . Calcium 02/14/2015 9.2  8.9 - 10.3 mg/dL Final  . Total Protein 02/14/2015 6.6  6.5 - 8.1 g/dL Final  . Albumin 02/14/2015 4.0  3.5 - 5.0 g/dL Final  . AST 02/14/2015 34  15 - 41 U/L Final  . ALT 02/14/2015 31  14 - 54 U/L Final  . Alkaline Phosphatase 02/14/2015 64  38 - 126 U/L Final  . Total Bilirubin 02/14/2015 0.4  0.3 - 1.2 mg/dL Final  . GFR calc non Af Amer 02/14/2015 >60  >60 mL/min Final  . GFR calc Af Amer 02/14/2015 >60  >60 mL/min Final   Comment: (NOTE) The eGFR has been calculated using the CKD EPI equation. This calculation has not been validated in all clinical situations. eGFR's persistently <60 mL/min signify possible Chronic Kidney Disease.   . Anion gap 02/14/2015 6  5 - 15 Final      STUDIES: IMPRESSION: 1. 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 The fourth ventricle remains patent and there is no significant intracranial mass effect at this time. 2. No other metastatic disease identified. 3. 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  Electronically Signed: By: Lemmie Evens  [place] Crane M.D. 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 Platelet count is low secondary to gemcitabine so dose was decreased Treatment will be changed to Taxotere and gemcitabine And carboplatin would be eliminated Has been discussed with the patient and family      Cancer of lung, upper lobe   Staging form: Lung, AJCC 7th Edition     Clinical: Stage IV (T1, N2, M1b) - Marni Griffon, MD   02/20/2015 8:00 AM

## 2015-02-20 NOTE — Progress Notes (Signed)
t  [place] @ [facility] Telephone:(336) [phone]  Fax:(336) [place]: [birth year]  MR#: [id number]  TKP#:[id number]  Patient Care Team: Adin Hector, MD as PCP - General (Internal Medicine)  CHIEF COMPLAINT:  Chief Complaint  Patient presents with  . OTHER    Oncology History   Non-small cell carcinoma of lung T1 N2 M1stage IV diseasePET scan is consistent with one small lesion in the T11 as well as suspected lesion in liver (diagnoses was in July of 2015) right upper lobe tumor with metastases to the left liver and mediastinal lymph node  MRI of liver conforms metastases to the liver.  MRI of thoracic spine raises possibility of metastases to the spine however this is inconclusive study.  (August, 2015) 2.  Patient was started on carboplatinum and Taxol started on November 25, 2013. 6 cycle of chemotherapy with carboplatinum and Taxol.  Last treatment was March 16, 2014 7.because of progressing disease on CT scan patient has been started on NIVO   in  February of 2016 8.  Patient is off NIVO because of diarrhea (June, 2016) 7.  Will be started on carboplatinum and gemcitabine from July, 19th, 2016   8.  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  Diagnosis by MRI scan of brain October of 2016 7.  Patient had stereotactic brain radiation therapy finished in October of 2016   Oncology Flowsheet 11/17/2014 11/24/2014 12/08/2014 12/29/2014 01/05/2015 02/09/2015 02/16/2015  Day, Cycle Day 1, Cycle 2 Day 8, Cycle 2 Day 1, Cycle 3 Day 1, Cycle 4 Day 8, Cycle 4 Day 1, Cycle 5 Day 8, Cycle 5  CARBOplatin (PARAPLATIN) IV 390 mg - 390 mg 390 mg - 390 mg -  dexamethasone (DECADRON) IV [ 12 mg ] - [ 12 mg ] [ 12 mg ] [ 10 mg ] [ 12 mg ] [ 10 mg ]  fosaprepitant (EMEND) IV [ 150 mg ] - [ 150 mg ] [ 150 mg ] - [ 150 mg ] -  gemcitabine (GEMZAR) IV 1,000 mg/m2 1,000 mg/m2 1,000 mg/m2 1,000 mg/m2 1,000 mg/m2 1,000 mg/m2 988.5932 mg  LORazepam (ATIVAN) IV - - - 0.5 mg - - -    nivolumab (OPDIVO) IV - - - - - - -  ondansetron (ZOFRAN) IV [ 8 mg ] [ 8 mg ] - - [ 8 mg ] - [ 8 mg ]  palonosetron (ALOXI) IV 0.25 mg - 0.25 mg 0.25 mg - 0.25 mg -    INTERVAL HISTORY:  79 year old lady who has been diagnosed to have stage IV carcinoma off   Patient came as an acute add-on complaining of swelling of the whole body.  Eyelids are swollen lower extremity is swollen. After last chemotherapy patient had developed a rash in upper extremity.  Previously patient has been known to have hypersensitivity reaction to carboplatinum Patient  Is quite concerned about swelling.    REVIEW OF SYSTEMS:   GENERAL:  Feels good.  Active.  No fevers, sweats or weight loss. PERFORMANCE STATUS (ECOG):  01 HEENT:  No visual changes, runny nose, sore throat, mouth sores or tenderness. Periorbital edema. Lungs: No shortness of breath or cough.  No hemoptysis. Cardiac:  No chest pain, palpitations, orthopnea, or PND. GI: Diarrhea is improving Abdominal discomfort.  No blood or mucus in the stool.  Watery stool 2 or 3 a day.  No chills.  No fever. GU:  No urgency, frequency, dysuria, or hematuria. Musculoskeletal:  No back pain.  No joint pain.  No muscle tenderness. Extremities:  No pain or swelling. Skin:  No rashes or skin changes. Neurological system as mentioned in history of present illness Endocrine:  No diabetes, thyroid issues, hot flashes or night sweats. Psych:  No mood changes, depression or anxiety. Pain:  No focal pain. Review of systems:  All other systems reviewed and found to be negative.  As per HPI. Otherwise, a complete review of systems is negatve.  PAST MEDICAL HISTORY: Past Medical History  Diagnosis Date  . Lung cancer ([place]) non small cell    PAST SURGICAL HISTORY: No significant past surgical history other than mentioned below FAMILY HISTORY There is no significant family history of breast cancer, ovarian cancer, colon cancer Smoking History 0.5(1)60  years(1)  PFSH: Comments: family history of colon cancer cancer of uterus and history of hypertension and heart disease  Social History: positive tobacco  Smoker: Smoking cessation counseling performed  Smoker- Packs Per Day: 0.5  Smoker- How Many Years: 29-  Additional Past Medical and Surgical History: As mentioned above         ADVANCED DIRECTIVES: Patient does not have any advanced healthcare directive. Information has been given.   HEALTH MAINTENANCE: Social History  Substance Use Topics  . Smoking status: Current Every Day Smoker -- 0.50 packs/day for 60 years  . Smokeless tobacco: None  . Alcohol Use: None       Allergies  Allergen Reactions  . Codeine Other (See Comments) and Nausea Only    GI Distress  . Hydrochlorothiazide Other (See Comments)    hypercalcemia  . Prednisone Swelling      OBJECTIVE:  Filed Vitals:   02/14/15 1613  BP: 142/84  Pulse: 65  Temp: 97.1 F (36.2 C)     Body mass index is 31 kg/(m^2).    ECOG FS:1 - Symptomatic but completely ambulatory  PHYSICAL EXAM: Goal status: Performance status is good.  Patient has not lost significant weight HEENT: No evidence of stomatitis. Sclera and conjunctivae :: No jaundice.   pale looking. perry orbital edema Lungs: Air  entry equal on both sides.  No rhonchi.  No rales.  Cardiac: Heart sounds are normal.  No pericardial rub.  No murmur. Lymphatic system: Cervical, axillary, inguinal, lymph nodes not palpable GI: Abdomen is soft.  No ascites.  Liver spleen not palpable.  No tenderness.  Bowel sounds are within normal limit Lower extremity: Trace edema of lower extremity Neurological system: : Diplopia has improved.  Cranial nerves are improving.  Ataxia is improving.  No other localizing symptom Upper and lower extremity deep motor and sensory system within normal limit Skin: No rash.  No ecchymosis.. NO clinical signs of dehydration LAB RESULTS:  Appointment on 02/14/2015  Component  Date Value Ref Range Status  . WBC 02/14/2015 7.3  3.6 - 11.0 K/uL Final  . RBC 02/14/2015 4.05  3.80 - 5.20 MIL/uL Final  . Hemoglobin 02/14/2015 13.9  12.0 - 16.0 g/dL Final  . HCT 02/14/2015 41.1  35.0 - 47.0 % Final  . MCV 02/14/2015 101.5* 80.0 - 100.0 fL Final  . MCH 02/14/2015 34.3* 26.0 - 34.0 pg Final  . MCHC 02/14/2015 33.8  32.0 - 36.0 g/dL Final  . RDW 02/14/2015 16.7* 11.5 - 14.5 % Final  . Platelets 02/14/2015 117* 150 - 440 K/uL Final  . Neutrophils Relative % 02/14/2015 80   Final  . Neutro Abs 02/14/2015 5.9  1.4 - 6.5 K/uL Final  .  Lymphocytes Relative 02/14/2015 18   Final  . Lymphs Abs 02/14/2015 1.3  1.0 - 3.6 K/uL Final  . Monocytes Relative 02/14/2015 1   Final  . Monocytes Absolute 02/14/2015 0.1* 0.2 - 0.9 K/uL Final  . Eosinophils Relative 02/14/2015 1   Final  . Eosinophils Absolute 02/14/2015 0.0  0 - 0.7 K/uL Final  . Basophils Relative 02/14/2015 0   Final  . Basophils Absolute 02/14/2015 0.0  0 - 0.1 K/uL Final  . Sodium 02/14/2015 130* 135 - 145 mmol/L Final  . Potassium 02/14/2015 3.2* 3.5 - 5.1 mmol/L Final  . Chloride 02/14/2015 92* 101 - 111 mmol/L Final  . CO2 02/14/2015 32  22 - 32 mmol/L Final  . Glucose, Bld 02/14/2015 116* 65 - 99 mg/dL Final  . BUN 02/14/2015 20  6 - 20 mg/dL Final  . Creatinine, Ser 02/14/2015 0.90  0.44 - 1.00 mg/dL Final  . Calcium 02/14/2015 9.2  8.9 - 10.3 mg/dL Final  . Total Protein 02/14/2015 6.6  6.5 - 8.1 g/dL Final  . Albumin 02/14/2015 4.0  3.5 - 5.0 g/dL Final  . AST 02/14/2015 34  15 - 41 U/L Final  . ALT 02/14/2015 31  14 - 54 U/L Final  . Alkaline Phosphatase 02/14/2015 64  38 - 126 U/L Final  . Total Bilirubin 02/14/2015 0.4  0.3 - 1.2 mg/dL Final  . GFR calc non Af Amer 02/14/2015 >60  >60 mL/min Final  . GFR calc Af Amer 02/14/2015 >60  >60 mL/min Final   Comment: (NOTE) The eGFR has been calculated using the CKD EPI equation. This calculation has not been validated in all clinical situations. eGFR's  persistently <60 mL/min signify possible Chronic Kidney Disease.   . Anion gap 02/14/2015 6  5 - 15 Final      STUDIES: IMPRESSION: 1. 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 The fourth ventricle remains patent and there is no significant intracranial mass effect at this time. 2. No other metastatic disease identified. 3. 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  Electronically Signed: By: Genevie Ann M.D. 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 In any event carboplatinum would be discontinued from next cycle onwards Patient  was reassured that swelling will eventually go down and was advised to use Benadryl If there is any further complications she should get in touch with [place]      Cancer of lung, upper lobe   Staging form: Lung, AJCC 7th Edition     Clinical: Stage IV (T1, N2, M1b) - Marni Griffon, MD   02/20/2015 9:50 AM

## 2015-02-25 ENCOUNTER — Encounter: Payer: Self-pay | Admitting: Radiation Oncology

## 2015-02-25 ENCOUNTER — Ambulatory Visit
Admission: RE | Admit: 2015-02-25 | Discharge: 2015-02-25 | Disposition: A | Discharge status: Home / Self Care | Payer: Medicare Other | Source: Ambulatory Visit | Attending: Radiation Oncology | Admitting: Radiation Oncology

## 2015-02-25 VITALS — BP 145/83 | HR 102 | Temp 95.6°F | Resp 20 | Wt 163.5 lb

## 2015-02-25 DIAGNOSIS — C7931 Secondary malignant neoplasm of brain: Secondary | ICD-10-CM

## 2015-02-25 NOTE — Progress Notes (Signed)
Radiation Oncology Follow up Note  Name: Jill Richards   Date:   02/25/2015 MRN:  [id number] DOB: 1935-06-19    This 79 y.o. female presents to the clinic today for follow-up for stage IV lung cancer with brain stem metastasis.  REFERRING PROVIDER: Adin Hector, MD  HPI: Patient is a 79 year old female now out 1 month having completed radiation therapy to her brain stem for a 1.3 cm lesion. She has known stage IV lung cancer diagnosed in July 2015 with T11 metastasis as well as liver metastasis. She is seen today in routine follow-up and is doing well.. She had been on gemcitabine and carboplatinum although had an allergic reaction carboplatinum has been discontinued. She is doing well clinically. She specifically denies change in visual fields any motor or sensory loss or balance issues. She does occasionally have a slight headache. She is being discontinued from her steroids.  COMPLICATIONS OF TREATMENT: none  FOLLOW UP COMPLIANCE: keeps appointments   PHYSICAL EXAM:  BP 145/83 mmHg  Pulse 102  Temp(Src) 95.6 F (35.3 C)  Resp 20  Wt 163 lb 7.5 oz (74.15 kg) Cranial nerves II through XII are grossly intact. Crude visual fields are within normal range. Motor sensory and DTR levels are equal and symmetric in the upper lower extremities. Proprioception is intact. Well-developed well-nourished patient in NAD. HEENT reveals PERLA, EOMI, discs not visualized.  Oral cavity is clear. No oral mucosal lesions are identified. Neck is clear without evidence of cervical or supraclavicular adenopathy. Lungs are clear to A&P. Cardiac examination is essentially unremarkable with regular rate and rhythm without murmur rub or thrill. Abdomen is benign with no organomegaly or masses noted. Motor sensory and DTR levels are equal and symmetric in the upper and lower extremities. Cranial nerves II through XII are grossly intact. Proprioception is intact. No peripheral adenopathy or edema is  identified. No motor or sensory levels are noted. Crude visual fields are within normal range.  RADIOLOGY RESULTS: No current films for review  PLAN: At the present time she is clinically stable from her brain stem metastasis. She continues close follow-up care and systemic chemotherapy with medical oncology. I will turn follow-up care over to medical oncology. We'll be happy to reevaluate the patient at any time should she need further palliative treatment for her stage IV lung cancer.  I would like to take this opportunity for allowing me to participate in the care of your patient.Armstead Peaks., MD

## 2015-03-02 ENCOUNTER — Other Ambulatory Visit: Payer: Self-pay | Admitting: *Deleted

## 2015-03-02 ENCOUNTER — Telehealth: Payer: Self-pay | Admitting: *Deleted

## 2015-03-02 DIAGNOSIS — C787 Secondary malignant neoplasm of liver and intrahepatic bile duct: Secondary | ICD-10-CM

## 2015-03-02 DIAGNOSIS — C3410 Malignant neoplasm of upper lobe, unspecified bronchus or lung: Secondary | ICD-10-CM

## 2015-03-02 MED ORDER — FUROSEMIDE 20 MG PO TABS: 20.0000 mg | ORAL_TABLET | Freq: Every day | ORAL | Status: DC

## 2015-03-02 MED ORDER — POTASSIUM CHLORIDE CRYS ER 20 MEQ PO TBCR: 20.0000 meq | EXTENDED_RELEASE_TABLET | Freq: Every day | ORAL | Status: DC

## 2015-03-02 NOTE — Telephone Encounter (Signed)
Has swelled up again in her face ankles and feet. Asking for refill on her furosemide

## 2015-03-02 NOTE — Telephone Encounter (Signed)
Ok to refill per Dr Rogue Bussing. E scribed to [place] per family request

## 2015-03-07 ENCOUNTER — Telehealth: Payer: Self-pay | Admitting: *Deleted

## 2015-03-07 DIAGNOSIS — C3410 Malignant neoplasm of upper lobe, unspecified bronchus or lung: Secondary | ICD-10-CM

## 2015-03-07 MED ORDER — DEXAMETHASONE 4 MG PO TABS: ORAL_TABLET | ORAL | Status: DC

## 2015-03-07 NOTE — Telephone Encounter (Signed)
Have her restart steroids, 8 mg daily per Dr Oliva Bustard and will revaluate if she needs an MRI when seen on Wednesday

## 2015-03-09 ENCOUNTER — Inpatient Hospital Stay: Payer: Medicare Other

## 2015-03-09 ENCOUNTER — Encounter: Payer: Self-pay | Admitting: Emergency Medicine

## 2015-03-09 ENCOUNTER — Encounter: Payer: Self-pay | Admitting: Oncology

## 2015-03-09 ENCOUNTER — Inpatient Hospital Stay (HOSPITAL_BASED_OUTPATIENT_CLINIC_OR_DEPARTMENT_OTHER): Payer: Medicare Other | Admitting: Oncology

## 2015-03-09 ENCOUNTER — Emergency Department
Admission: EM | Admit: 2015-03-09 | Discharge: 2015-03-10 | Disposition: A | Discharge status: Home / Self Care | Payer: Medicare Other | Attending: Emergency Medicine | Admitting: Emergency Medicine

## 2015-03-09 VITALS — BP 129/76 | HR 85 | Temp 94.5°F | Wt 159.4 lb

## 2015-03-09 DIAGNOSIS — S0990XA Unspecified injury of head, initial encounter: Secondary | ICD-10-CM | POA: Diagnosis present

## 2015-03-09 DIAGNOSIS — Z7952 Long term (current) use of systemic steroids: Secondary | ICD-10-CM | POA: Diagnosis not present

## 2015-03-09 DIAGNOSIS — C7931 Secondary malignant neoplasm of brain: Secondary | ICD-10-CM

## 2015-03-09 DIAGNOSIS — R51 Headache: Secondary | ICD-10-CM

## 2015-03-09 DIAGNOSIS — W1839XA Other fall on same level, initial encounter: Secondary | ICD-10-CM | POA: Insufficient documentation

## 2015-03-09 DIAGNOSIS — C3412 Malignant neoplasm of upper lobe, left bronchus or lung: Secondary | ICD-10-CM | POA: Diagnosis not present

## 2015-03-09 DIAGNOSIS — R630 Anorexia: Secondary | ICD-10-CM

## 2015-03-09 DIAGNOSIS — R1310 Dysphagia, unspecified: Secondary | ICD-10-CM

## 2015-03-09 DIAGNOSIS — Y998 Other external cause status: Secondary | ICD-10-CM | POA: Insufficient documentation

## 2015-03-09 DIAGNOSIS — C771 Secondary and unspecified malignant neoplasm of intrathoracic lymph nodes: Secondary | ICD-10-CM

## 2015-03-09 DIAGNOSIS — G939 Disorder of brain, unspecified: Secondary | ICD-10-CM

## 2015-03-09 DIAGNOSIS — C3410 Malignant neoplasm of upper lobe, unspecified bronchus or lung: Secondary | ICD-10-CM

## 2015-03-09 DIAGNOSIS — S0003XA Contusion of scalp, initial encounter: Secondary | ICD-10-CM | POA: Insufficient documentation

## 2015-03-09 DIAGNOSIS — F17200 Nicotine dependence, unspecified, uncomplicated: Secondary | ICD-10-CM | POA: Diagnosis not present

## 2015-03-09 DIAGNOSIS — Z923 Personal history of irradiation: Secondary | ICD-10-CM

## 2015-03-09 DIAGNOSIS — Z79899 Other long term (current) drug therapy: Secondary | ICD-10-CM | POA: Insufficient documentation

## 2015-03-09 DIAGNOSIS — Y9301 Activity, walking, marching and hiking: Secondary | ICD-10-CM | POA: Insufficient documentation

## 2015-03-09 DIAGNOSIS — C787 Secondary malignant neoplasm of liver and intrahepatic bile duct: Secondary | ICD-10-CM

## 2015-03-09 DIAGNOSIS — H532 Diplopia: Secondary | ICD-10-CM

## 2015-03-09 DIAGNOSIS — Y92009 Unspecified place in unspecified non-institutional (private) residence as the place of occurrence of the external cause: Secondary | ICD-10-CM | POA: Insufficient documentation

## 2015-03-09 DIAGNOSIS — R110 Nausea: Secondary | ICD-10-CM

## 2015-03-09 DIAGNOSIS — F17210 Nicotine dependence, cigarettes, uncomplicated: Secondary | ICD-10-CM

## 2015-03-09 LAB — CBC WITH DIFFERENTIAL/PLATELET
Basophils Absolute: 0 10*3/uL (ref 0–0.1)
Basophils Relative: 0 %
Eosinophils Absolute: 0 10*3/uL (ref 0–0.7)
Eosinophils Relative: 0 %
HEMATOCRIT: 38.8 % (ref 35.0–47.0)
HEMOGLOBIN: 13.3 g/dL (ref 12.0–16.0)
LYMPHS ABS: 0.8 10*3/uL — AB (ref 1.0–3.6)
LYMPHS PCT: 9 %
MCH: 34.3 pg — AB (ref 26.0–34.0)
MCHC: 34.2 g/dL (ref 32.0–36.0)
MCV: 100.3 fL — ABNORMAL HIGH (ref 80.0–100.0)
MONOS PCT: 11 %
Monocytes Absolute: 1 10*3/uL — ABNORMAL HIGH (ref 0.2–0.9)
NEUTROS ABS: 7.3 10*3/uL — AB (ref 1.4–6.5)
NEUTROS PCT: 80 %
PLATELETS: 540 10*3/uL — AB (ref 150–440)
RBC: 3.87 MIL/uL (ref 3.80–5.20)
RDW: 15.9 % — ABNORMAL HIGH (ref 11.5–14.5)
WBC: 9.1 10*3/uL (ref 3.6–11.0)

## 2015-03-09 LAB — COMPREHENSIVE METABOLIC PANEL
ALT: 23 U/L (ref 14–54)
ANION GAP: 11 (ref 5–15)
AST: 37 U/L (ref 15–41)
Albumin: 4.5 g/dL (ref 3.5–5.0)
Alkaline Phosphatase: 61 U/L (ref 38–126)
BUN: 8 mg/dL (ref 6–20)
CHLORIDE: 98 mmol/L — AB (ref 101–111)
CO2: 24 mmol/L (ref 22–32)
Calcium: 9.5 mg/dL (ref 8.9–10.3)
Creatinine, Ser: 0.81 mg/dL (ref 0.44–1.00)
Glucose, Bld: 99 mg/dL (ref 65–99)
POTASSIUM: 3.1 mmol/L — AB (ref 3.5–5.1)
SODIUM: 133 mmol/L — AB (ref 135–145)
Total Bilirubin: 0.6 mg/dL (ref 0.3–1.2)
Total Protein: 7.1 g/dL (ref 6.5–8.1)

## 2015-03-09 MED ORDER — SODIUM CHLORIDE 0.9 % IV SOLN
INTRAVENOUS | Status: DC
  Administered 2015-03-09: 10:00:00 via INTRAVENOUS
  Filled 2015-03-09: qty 1000

## 2015-03-09 MED ORDER — SODIUM CHLORIDE 0.9 % IJ SOLN
10.0000 mL | Freq: Once | INTRAMUSCULAR | Status: AC
  Administered 2015-03-09: 10 mL via INTRAVENOUS
  Filled 2015-03-09: qty 10

## 2015-03-09 MED ORDER — HEPARIN SOD (PORK) LOCK FLUSH 100 UNIT/ML IV SOLN
INTRAVENOUS | Status: AC
  Filled 2015-03-09: qty 5

## 2015-03-09 MED ORDER — DEXAMETHASONE 4 MG PO TABS: ORAL_TABLET | ORAL | Status: DC

## 2015-03-09 MED ORDER — SODIUM CHLORIDE 0.9 % IV SOLN
Freq: Once | INTRAVENOUS | Status: AC
  Administered 2015-03-09: 10:00:00 via INTRAVENOUS
  Filled 2015-03-09: qty 4

## 2015-03-09 MED ORDER — HEPARIN SOD (PORK) LOCK FLUSH 100 UNIT/ML IV SOLN
500.0000 [IU] | Freq: Once | INTRAVENOUS | Status: AC
  Administered 2015-03-09: 500 [IU] via INTRAVENOUS

## 2015-03-09 NOTE — Progress Notes (Signed)
t  [place] @ [facility] Telephone:(336) [phone]  Fax:(336) [place]: 1936-02-06  MR#: [id number]  AFB#:[id number]  Patient Care Team: Adin Hector, MD as PCP - General (Internal Medicine)  CHIEF COMPLAINT:  Chief Complaint  Patient presents with  . Lung Cancer    Oncology History   Non-small cell carcinoma of lung T1 N2 M1stage IV diseasePET scan is consistent with one small lesion in the T11 as well as suspected lesion in liver (diagnoses was in July of 2015) right upper lobe tumor with metastases to the left liver and mediastinal lymph node  MRI of liver conforms metastases to the liver.  MRI of thoracic spine raises possibility of metastases to the spine however this is inconclusive study.  (August, 2015) 2.  Patient was started on carboplatinum and Taxol started on November 25, 2013. 6 cycle of chemotherapy with carboplatinum and Taxol.  Last treatment was March 16, 2014 7.because of progressing disease on CT scan patient has been started on NIVO   in  February of 2016 8.  Patient is off NIVO because of diarrhea (June, 2016) 7.  Will be started on carboplatinum and gemcitabine from July, 19th, 2016   8.  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  Diagnosis by MRI scan of brain October of 2016 7.  Patient had stereotactic brain radiation therapy finished in October of 2016   Oncology Flowsheet 11/17/2014 11/24/2014 12/08/2014 12/29/2014 01/05/2015 02/09/2015 02/16/2015  Day, Cycle Day 1, Cycle 2 Day 8, Cycle 2 Day 1, Cycle 3 Day 1, Cycle 4 Day 8, Cycle 4 Day 1, Cycle 5 Day 8, Cycle 5  CARBOplatin (PARAPLATIN) IV 390 mg - 390 mg 390 mg - 390 mg -  dexamethasone (DECADRON) IV [ 12 mg ] - [ 12 mg ] [ 12 mg ] [ 10 mg ] [ 12 mg ] [ 10 mg ]  fosaprepitant (EMEND) IV [ 150 mg ] - [ 150 mg ] [ 150 mg ] - [ 150 mg ] -  gemcitabine (GEMZAR) IV 1,000 mg/m2 1,000 mg/m2 1,000 mg/m2 1,000 mg/m2 1,000 mg/m2 1,000 mg/m2 988.5932 mg  LORazepam (ATIVAN) IV - - - 0.5 mg - - -    nivolumab (OPDIVO) IV - - - - - - -  ondansetron (ZOFRAN) IV [ 8 mg ] [ 8 mg ] - - [ 8 mg ] - [ 8 mg ]  palonosetron (ALOXI) IV 0.25 mg - 0.25 mg 0.25 mg - 0.25 mg -    INTERVAL HISTORY:  79 year old lady who has been diagnosed to have stage IV carcinoma off  Patient started developing double vision.  Inability to walk.  Patient was started back on 8 mg of Decadron with some improvement in headache but double vision persist Persistent nausea Poor appetite Only drinking liquids because solid food gets stuck in the throat.  (Dysphagia to solid food) Patient has a previous history of metastases to the brain status post stereotactic brain radiation therapy   REVIEW OF SYSTEMS:   GENERAL:  Feels good.  Active.  No fevers, sweats or weight loss. PERFORMANCE STATUS (ECOG):  01 HEENT:  No visual changes, runny nose, sore throat, mouth sores or tenderness. Lungs: No shortness of breath or cough.  No hemoptysis. Cardiac:  No chest pain, palpitations, orthopnea, or PND. GI: Diarrhea is improving Abdominal discomfort.  No blood or mucus in the stool.  Watery stool 2 or 3 a day.  No chills.  No fever.  GU:  No urgency, frequency, dysuria, or hematuria. Musculoskeletal:  No back pain.  No joint pain.  No muscle tenderness. Extremities:  No pain or swelling. Skin:  No rashes or skin changes. Neurological system: Progressive symptoms as described about after taking it milligram of Decadron patient has noticed some improvement in headache Endocrine:  No diabetes, thyroid issues, hot flashes or night sweats. Psych:  No mood changes, depression or anxiety. Pain:  No focal pain. Review of systems:  All other systems reviewed and found to be negative.  As per HPI. Otherwise, a complete review of systems is negatve.  PAST MEDICAL HISTORY: Past Medical History  Diagnosis Date  . Lung cancer ([place]) non small cell    PAST SURGICAL HISTORY: No significant past surgical history other than mentioned  below FAMILY HISTORY There is no significant family history of breast cancer, ovarian cancer, colon cancer Smoking History 0.5(1)60 years(1)  PFSH: Comments: family history of colon cancer cancer of uterus and history of hypertension and heart disease  Social History: positive tobacco  Smoker: Smoking cessation counseling performed  Smoker- Packs Per Day: 0.5  Smoker- How Many Years: 25-  Additional Past Medical and Surgical History: As mentioned above         ADVANCED DIRECTIVES: Patient does not have any advanced healthcare directive. Information has been given.   HEALTH MAINTENANCE: Social History  Substance Use Topics  . Smoking status: Current Every Day Smoker -- 0.50 packs/day for 60 years  . Smokeless tobacco: Not on file  . Alcohol Use: Not on file       Allergies  Allergen Reactions  . Codeine Other (See Comments) and Nausea Only    GI Distress  . Hydrochlorothiazide Other (See Comments)    hypercalcemia  . Prednisone Swelling      OBJECTIVE:  Filed Vitals:   03/09/15 0840  BP: 129/76  Pulse: 85  Temp: 94.5 F (34.7 C)     Body mass index is 30.13 kg/(m^2).    ECOG FS:1 - Symptomatic but completely ambulatory  PHYSICAL EXAM: Goal status: Performance status is good.  Patient has not lost significant weight HEENT: No evidence of stomatitis. Sclera and conjunctivae :: No jaundice.   pale looking. Lungs: Air  entry equal on both sides.  No rhonchi.  No rales.  Cardiac: Heart sounds are normal.  No pericardial rub.  No murmur. Lymphatic system: Cervical, axillary, inguinal, lymph nodes not palpable GI: Abdomen is soft.  No ascites.  Liver spleen not palpable.  No tenderness.  Bowel sounds are within normal limit Lower extremity: No edema Neurological system: : Diplopia.  Cerebellar ataxia.  Upper and lower extremity deep motor and sensory system within normal limit Skin: No rash.  No ecchymosis.. NO clinical signs of dehydration LAB  RESULTS:  Appointment on 03/09/2015  Component Date Value Ref Range Status  . Sodium 03/09/2015 133* 135 - 145 mmol/L Final  . Potassium 03/09/2015 3.1* 3.5 - 5.1 mmol/L Final  . Chloride 03/09/2015 98* 101 - 111 mmol/L Final  . CO2 03/09/2015 24  22 - 32 mmol/L Final  . Glucose, Bld 03/09/2015 99  65 - 99 mg/dL Final  . BUN 03/09/2015 8  6 - 20 mg/dL Final  . Creatinine, Ser 03/09/2015 0.81  0.44 - 1.00 mg/dL Final  . Calcium 03/09/2015 9.5  8.9 - 10.3 mg/dL Final  . Total Protein 03/09/2015 7.1  6.5 - 8.1 g/dL Final  . Albumin 03/09/2015 4.5  3.5 - 5.0 g/dL Final  .  AST 03/09/2015 37  15 - 41 U/L Final  . ALT 03/09/2015 23  14 - 54 U/L Final  . Alkaline Phosphatase 03/09/2015 61  38 - 126 U/L Final  . Total Bilirubin 03/09/2015 0.6  0.3 - 1.2 mg/dL Final  . GFR calc non Af Amer 03/09/2015 >60  >60 mL/min Final  . GFR calc Af Amer 03/09/2015 >60  >60 mL/min Final   Comment: (NOTE) The eGFR has been calculated using the CKD EPI equation. This calculation has not been validated in all clinical situations. eGFR's persistently <60 mL/min signify possible Chronic Kidney Disease.   . Anion gap 03/09/2015 11  5 - 15 Final  . WBC 03/09/2015 9.1  3.6 - 11.0 K/uL Final  . RBC 03/09/2015 3.87  3.80 - 5.20 MIL/uL Final  . Hemoglobin 03/09/2015 13.3  12.0 - 16.0 g/dL Final  . HCT 03/09/2015 38.8  35.0 - 47.0 % Final  . MCV 03/09/2015 100.3* 80.0 - 100.0 fL Final  . MCH 03/09/2015 34.3* 26.0 - 34.0 pg Final  . MCHC 03/09/2015 34.2  32.0 - 36.0 g/dL Final  . RDW 03/09/2015 15.9* 11.5 - 14.5 % Final  . Platelets 03/09/2015 540* 150 - 440 K/uL Final  . Neutrophils Relative % 03/09/2015 80   Final  . Neutro Abs 03/09/2015 7.3* 1.4 - 6.5 K/uL Final  . Lymphocytes Relative 03/09/2015 9   Final  . Lymphs Abs 03/09/2015 0.8* 1.0 - 3.6 K/uL Final  . Monocytes Relative 03/09/2015 11   Final  . Monocytes Absolute 03/09/2015 1.0* 0.2 - 0.9 K/uL Final  . Eosinophils Relative 03/09/2015 0   Final  .  Eosinophils Absolute 03/09/2015 0.0  0 - 0.7 K/uL Final  . Basophils Relative 03/09/2015 0   Final  . Basophils Absolute 03/09/2015 0.0  0 - 0.1 K/uL Final      STUDIES: IMPRESSION: 1. 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 The fourth ventricle remains patent and there is no significant intracranial mass effect at this time. 2. No other metastatic disease identified. 3. 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  Electronically Signed: By: Genevie Ann M.D. 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  Initially after radiation therapy patient's condition improved however now has progressive symptoms and family described that is worse than before. Clinical examination shows cerebellar signs Proceed with MRI scan of brain for reevaluation Patient will receive intravenous Decadron and Zofran and will continue 8 mg of Decadron patient will be reevaluated with MRI scan is done Discussed that situation with patient's family. Suspected progressive disease in the brain versus progressive swelling      Cancer of lung, upper lobe   Staging form: Lung, AJCC 7th Edition     Clinical: Stage IV (T1, N2, M1b) - Jill Griffon, MD   03/09/2015 8:53 AM

## 2015-03-09 NOTE — Progress Notes (Signed)
Patient states she is having double vision again and is nauseated.  Tongue is numb so she is not eating solids.  States she is mostly drinking soups.

## 2015-03-09 NOTE — ED Notes (Signed)
Pt arrived to the ED  By EMS from home to get checked after sustaining a fall. Pt reports that she was setting her home alarm when she lost her balance and fell back. Pt denies LOC and reports that she is a cancer Pt, recently experiencing visual disturbances and weakness. Pt reports that she has an MRI scheduled for tomorrow to investigate the source of the visual disturbances and weakness. Pt is AOx4 in no apparent distress with a bump on the back of the head.

## 2015-03-09 NOTE — Progress Notes (Signed)
   03/09/15 0950  Clinical Encounter Type  Visited With Patient and family together  Visit Type Initial  Provided pastoral presence and support to patient and her daughter in the infusion suite of the cancer center.  [place] [phone]

## 2015-03-09 NOTE — ED Notes (Signed)
Pt arrived to the ED By EMS from home to get checked after sustaining a fall. Pt reports that she was setting her home alarm when she lost her balance and fell back. Pt denies LOC and reports that she is a cancer Pt, recently experiencing visual disturbances and weakness. Pt reports that she has an MRI scheduled for tomorrow to investigate the source of the visual disturbances and weakness. Pt is AOx4 in no apparent distress with a bump on the back of the head.

## 2015-03-10 ENCOUNTER — Telehealth: Payer: Self-pay | Admitting: *Deleted

## 2015-03-10 ENCOUNTER — Emergency Department: Payer: Medicare Other

## 2015-03-10 ENCOUNTER — Ambulatory Visit: Admission: RE | Admit: 2015-03-10 | Payer: Medicare Other | Source: Ambulatory Visit

## 2015-03-10 ENCOUNTER — Inpatient Hospital Stay: Payer: Medicare Other | Attending: Oncology | Admitting: Oncology

## 2015-03-10 VITALS — BP 115/71 | HR 99 | Temp 97.2°F

## 2015-03-10 DIAGNOSIS — C771 Secondary and unspecified malignant neoplasm of intrathoracic lymph nodes: Secondary | ICD-10-CM | POA: Diagnosis not present

## 2015-03-10 DIAGNOSIS — C3410 Malignant neoplasm of upper lobe, unspecified bronchus or lung: Secondary | ICD-10-CM

## 2015-03-10 DIAGNOSIS — R270 Ataxia, unspecified: Secondary | ICD-10-CM | POA: Insufficient documentation

## 2015-03-10 DIAGNOSIS — Z923 Personal history of irradiation: Secondary | ICD-10-CM | POA: Diagnosis not present

## 2015-03-10 DIAGNOSIS — F17210 Nicotine dependence, cigarettes, uncomplicated: Secondary | ICD-10-CM | POA: Insufficient documentation

## 2015-03-10 DIAGNOSIS — B370 Candidal stomatitis: Secondary | ICD-10-CM | POA: Diagnosis not present

## 2015-03-10 DIAGNOSIS — R1310 Dysphagia, unspecified: Secondary | ICD-10-CM | POA: Diagnosis not present

## 2015-03-10 DIAGNOSIS — C3411 Malignant neoplasm of upper lobe, right bronchus or lung: Secondary | ICD-10-CM | POA: Diagnosis present

## 2015-03-10 DIAGNOSIS — R51 Headache: Secondary | ICD-10-CM | POA: Diagnosis not present

## 2015-03-10 DIAGNOSIS — R269 Unspecified abnormalities of gait and mobility: Secondary | ICD-10-CM

## 2015-03-10 DIAGNOSIS — Z9221 Personal history of antineoplastic chemotherapy: Secondary | ICD-10-CM | POA: Insufficient documentation

## 2015-03-10 DIAGNOSIS — R2689 Other abnormalities of gait and mobility: Secondary | ICD-10-CM | POA: Diagnosis not present

## 2015-03-10 DIAGNOSIS — H532 Diplopia: Secondary | ICD-10-CM | POA: Insufficient documentation

## 2015-03-10 DIAGNOSIS — C7931 Secondary malignant neoplasm of brain: Secondary | ICD-10-CM | POA: Diagnosis not present

## 2015-03-10 DIAGNOSIS — C787 Secondary malignant neoplasm of liver and intrahepatic bile duct: Secondary | ICD-10-CM | POA: Diagnosis not present

## 2015-03-10 DIAGNOSIS — Z800 Family history of malignant neoplasm of digestive organs: Secondary | ICD-10-CM | POA: Insufficient documentation

## 2015-03-10 DIAGNOSIS — R5383 Other fatigue: Secondary | ICD-10-CM | POA: Diagnosis not present

## 2015-03-10 DIAGNOSIS — Z9181 History of falling: Secondary | ICD-10-CM | POA: Insufficient documentation

## 2015-03-10 DIAGNOSIS — G936 Cerebral edema: Secondary | ICD-10-CM | POA: Diagnosis not present

## 2015-03-10 DIAGNOSIS — R439 Unspecified disturbances of smell and taste: Secondary | ICD-10-CM | POA: Diagnosis not present

## 2015-03-10 DIAGNOSIS — R531 Weakness: Secondary | ICD-10-CM | POA: Diagnosis not present

## 2015-03-10 DIAGNOSIS — R197 Diarrhea, unspecified: Secondary | ICD-10-CM | POA: Diagnosis not present

## 2015-03-10 MED ORDER — GADOBENATE DIMEGLUMINE 529 MG/ML IV SOLN
15.0000 mL | Freq: Once | INTRAVENOUS | Status: AC | PRN
  Administered 2015-03-10: 14 mL via INTRAVENOUS

## 2015-03-10 MED ORDER — LEVETIRACETAM 500 MG PO TABS: 500.0000 mg | ORAL_TABLET | Freq: Two times a day (BID) | ORAL | Status: DC

## 2015-03-10 NOTE — ED Provider Notes (Signed)
[facility] Emergency Department Provider Note  ____________________________________________  Time seen: 11:45 PM  I have reviewed the triage vital signs and the nursing notes.   HISTORY  Chief Complaint Fall     HPI Jill Richards is a 79 y.o. female presents with history of accidental fall today while walking with occipital head injury. Patient states while ambulating with her walker she reached forward to grab something and lost her balance falling backwards resulting in occipital injury. Patient states she is unsure if she lost consciousness. 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 She is scheduled to have an MRI of the brain performed tomorrow as per Dr. Oliva Bustard requests. Patient states current pain score 4 out of 10 secondary to occipital headache   Past Medical History  Diagnosis Date  . Lung cancer ([place]) non small cell  . Lung cancer [facility])     Patient Active Problem List   Diagnosis Date Noted  . Chronic obstructive pulmonary disease ([place]) 01/26/2015  . Arthritis 08/18/2014  . CAFL (chronic airflow limitation) ([place]) 08/18/2014  . HLD (hyperlipidemia) 08/18/2014  . Adult hypothyroidism 08/18/2014  . BP (high blood pressure) 08/18/2014  . Cancer, metastatic to liver ([place]) 03/24/2014  . Cancer of lung, upper lobe ([place]) 03/24/2014  . Malignant neoplasm metastatic to liver ([place]) 03/24/2014  . Malignant neoplasm of upper lobe of lung ([place]) 03/24/2014    History reviewed. No pertinent past surgical history.  Current Outpatient Rx  Name  Route  Sig  Dispense  Refill  . albuterol (PROVENTIL HFA;VENTOLIN HFA) 108 (90 BASE) MCG/ACT inhaler   Inhalation   Inhale 2 puffs into the lungs every 6 (six) hours as needed for wheezing or shortness of breath.   1 Inhaler   3   . amLODipine (NORVASC) 10 MG tablet   Oral   Take 10 mg by mouth daily.         .  benzonatate (TESSALON) 200 MG capsule   Oral   Take by mouth.         . cetirizine (ZYRTEC) 10 MG tablet   Oral   Take 10 mg by mouth daily.         . Cholecalciferol (VITAMIN D3) 2000 UNITS TABS   Oral   Take 1 capsule by mouth daily.         Marland Kitchen dexamethasone (DECADRON) 4 MG tablet      Take 1 tablet PO twice a day   16 tablet   0   . fluconazole (DIFLUCAN) 100 MG tablet   Oral   Take 1 tablet (100 mg total) by mouth daily.   5 tablet   3   . furosemide (LASIX) 20 MG tablet   Oral   Take 1 tablet (20 mg total) by mouth daily.   7 tablet   0   . GuaiFENesin (MUCUS RELIEF ADULT PO)   Oral   Take 1 tablet by mouth daily as needed.         . hyoscyamine (LEVBID) 0.375 MG 12 hr tablet   Oral   Take 1 tablet (0.375 mg total) by mouth 2 (two) times daily.   60 tablet   1   . levothyroxine (SYNTHROID, LEVOTHROID) 125 MCG tablet   Oral   Take 125 mcg by mouth daily before breakfast.         . Multiple Vitamin (MULTIVITAMIN WITH MINERALS) TABS tablet   Oral  Take 1 tablet by mouth daily.         . ondansetron (ZOFRAN) 4 MG tablet   Oral   Take 4 mg by mouth every 6 (six) hours as needed for nausea or vomiting.         . potassium chloride SA (K-DUR,KLOR-CON) 20 MEQ tablet   Oral   Take 1 tablet (20 mEq total) by mouth daily.   7 tablet   0   . Probiotic Product (PROBIOTIC FORMULA PO)   Oral   Take 1 tablet by mouth daily.         . sucralfate (CARAFATE) 1 G tablet   Oral   Take 1 tablet (1 g total) by mouth 3 (three) times daily. Dissolve each tablet in 2-3 tbsp of warm water, then swish and swallow.   90 tablet   1     Allergies Codeine; Hydrochlorothiazide; and Prednisone  History reviewed. No pertinent family history.  Social History Social History  Substance Use Topics  . Smoking status: Current Every Day Smoker -- 0.50 packs/day for 60 years  . Smokeless tobacco: None  . Alcohol Use: None    Review of  Systems  Constitutional: Negative for fever. Eyes: Negative for visual changes. ENT: Negative for sore throat. Cardiovascular: Negative for chest pain. Respiratory: Negative for shortness of breath. Gastrointestinal: Negative for abdominal pain, vomiting and diarrhea. Genitourinary: Negative for dysuria. Musculoskeletal: Negative for back pain. Skin: Negative for rash. Neurological: positive for headaches  10-point ROS otherwise negative.  ____________________________________________   PHYSICAL EXAM:  VITAL SIGNS: ED Triage Vitals  Enc Vitals Group     BP 03/09/15 2326 139/75 mmHg     Pulse Rate 03/09/15 2326 90     Resp 03/09/15 2326 22     Temp 03/09/15 2326 97.8 F (36.6 C)     Temp Source 03/09/15 2326 Oral     SpO2 03/09/15 2326 99 %     Weight 03/09/15 2326 159 lb (72.122 kg)     Height 03/09/15 2326 '5\' 3"'$  (1.6 m)     Head Cir --      Peak Flow --      Pain Score 03/09/15 2330 8     Pain Loc --      Pain Edu? --      Excl. in [place]? --      Constitutional: Alert and oriented. Well appearing and in no distress. Eyes: Conjunctivae are normal. PERRL. Normal extraocular movements. ENT   Head: Normocephalic and atraumatic.   Nose: No congestion/rhinnorhea.   Mouth/Throat: Mucous membranes are moist.   Neck: No stridor. Hematological/Lymphatic/Immunilogical: No cervical lymphadenopathy. Cardiovascular: Normal rate, regular rhythm. Normal and symmetric distal pulses are present in all extremities. No murmurs, rubs, or gallops. Respiratory: Normal respiratory effort without tachypnea nor retractions. Breath sounds are clear and equal bilaterally. No wheezes/rales/rhonchi. Gastrointestinal: Soft and nontender. No distention. There is no CVA tenderness. Genitourinary: deferred Musculoskeletal: Nontender with normal range of motion in all extremities. No joint effusions.  No lower extremity tenderness nor edema. Neurologic:  Normal speech and language. No  gross focal neurologic deficits are appreciated. Speech is normal.  Skin:  Skin is warm, dry and intact. No rash noted. Psychiatric: Mood and affect are normal. Speech and behavior are normal. Patient exhibits appropriate insight and judgment.    RADIOLOGY   MR Brain W Wo Contrast (Final result) Result time: 03/10/15 03:03:55   Procedure changed from MR Brain W Contrast      Final  result by Rad Results In Interface (03/10/15 03:03:55)   Narrative:   CLINICAL DATA: Initial evaluation for acute trauma, fall.  EXAM: MRI HEAD WITHOUT AND WITH CONTRAST  TECHNIQUE: Multiplanar, multiecho pulse sequences of the brain and surrounding structures were obtained without and with intravenous contrast.  CONTRAST: 58m MULTIHANCE GADOBENATE DIMEGLUMINE 529 MG/ML IV SOLN  COMPARISON: Prior MRI from 01/12/2015.  FINDINGS: Again seen is a solid round enhancing mass within the left dorsal pons that measures 13 x 14 x 16 mm. This is increased in size from prior (previously 13 x 10 x 13 mm). This lesion appears slightly more necrotic centrally. Associated vasogenic edema seen within the adjacent pons, tracking superiorly into the left cerebral peduncle, left thalamus, and posterior limb of the left internal capsule, increased from prior. Edema also extends across the midline at the floor of the fourth ventricle, with involvement of the cerebellar peduncle is, left greater than right. Overall, edema is increased from prior. The fourth ventricle is stable in size without effacement. No hydrocephalus. No other metastatic lesions. Subtle developmental anomaly again noted within the posterior right frontal lobe (series 14, image 7).  No acute infarct. Gray-white matter differentiation maintained. Normal intravascular flow voids preserved. No acute or chronic intracranial hemorrhage.  No midline shift. No hydrocephalus. No extra-axial fluid collection. Scattered T2/FLAIR white matter  changes noted, mildly progressed from previous.  Craniocervical junction normal. Visualized upper cervical spine demonstrates no acute abnormality.  Pituitary gland normal. No acute abnormality about the orbits. Sequela prior lens extraction noted on the left.  Mild mucosal thickening within the ethmoidal air cells. Small right mastoid effusion. Left mastoid air cells clear.  Left parieto-occipital scalp edema, likely related to history of fall and trauma.  Circumscribed round cystic lesion in the right parapharyngeal space again seen, measuring 14 mm, stable. 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  IMPRESSION: 1. Interval increase in size of left dorsal pons brainstem metastasis now measuring 13 x 14 x 16 mm with increased vasogenic edema. Fourth ventricle remains patent without hydrocephalus or significant mass effect. 2. No other metastatic disease. 3. Small left parieto-occipital scalp contusion. 4. Stable 14 mm right parapharyngeal space mass. 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   Electronically Signed By: BJeannine BogaM.D. On: 03/10/2015 03:03      INITIAL IMPRESSION / ASSESSMENT AND PLAN / ED COURSE  Pertinent labs & imaging results that were available during my care of the patient were reviewed by me and considered in my medical decision making (see chart for details).    ____________________________________________   FINAL CLINICAL IMPRESSION(S) / ED DIAGNOSES  Final diagnoses:  Scalp contusion, initial encounter  Brain metastasis ([facility]      RGregor Hams MD 03/10/15 [phone]

## 2015-03-10 NOTE — Telephone Encounter (Signed)
Per Dr Oliva Bustard need to see pt this afternoon, Spoke with patient who agrees to come in at 51 today

## 2015-03-10 NOTE — ED Notes (Signed)
Pt went to MRI.

## 2015-03-10 NOTE — ED Notes (Signed)
Pt requested ice, and a cup of it was given.

## 2015-03-10 NOTE — Telephone Encounter (Addendum)
Called to report that she fell last night striking her head and lost consciousness for an undetermined period of time. Went to ER and they did MRI of brain which was set up for today and needs to be cancelled. Wants [facility] to know so he can look at results IMPRESSION: 1. Interval increase in size of left dorsal pons brainstem metastasis now measuring 13 x 14 x 16 mm with increased vasogenic edema. Fourth ventricle remains patent without hydrocephalus or significant mass effect. 2. No other metastatic disease. 3. Small left parieto-occipital scalp contusion. 4. Stable 14 mm right parapharyngeal space mass. 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

## 2015-03-10 NOTE — Discharge Instructions (Signed)
Facial or Scalp Contusion  A facial or scalp contusion is a deep bruise on the face or head. Contusions happen when an injury causes bleeding under the skin. Signs of bruising include pain, puffiness (swelling), and discolored skin. The contusion may turn blue, purple, or yellow. HOME CARE  Only take medicines as told by your doctor.  Put ice on the injured area.  Put ice in a plastic bag.  Place a towel between your skin and the bag.  Leave the ice on for 20 minutes, 2-3 times a day. GET HELP IF:  You have bite problems.  You have pain when chewing.  You are worried about your face not healing normally. GET HELP RIGHT AWAY IF:   You have severe pain or a headache and medicine does not help.  You are very tired or confused, or your personality changes.  You throw up (vomit).  You have a nosebleed that will not stop.  You see two of everything (double vision) or have blurry vision.  You have fluid coming from your nose or ear.  You have problems walking or using your arms or legs. MAKE SURE YOU:   Understand these instructions.  Will watch your condition.  Will get help right away if you are not doing well or get worse.   This information is not intended to replace advice given to you by your health care provider. Make sure you discuss any questions you have with your health care provider.   Document Released: 03/15/2011 Document Revised: 04/16/2014 Document Reviewed: 11/06/2012 Elsevier Interactive Patient Education 2016 [place].  Metastatic Brain Tumor A metastatic brain tumor is a growth in your brain. It is made of cancer cells from another part of your body that traveled to your brain through your bloodstream, along the nerves of your brain (cranial nerves), or through the opening at the base of your skull.  CAUSES  The most common cause of a metastatic brain tumor in men is lung cancer. In women, it is breast cancer. Other common causes  include:  Unknown types of cancers.  Skin cancer (melanoma).  Colon cancer.  Kidney cancer. SIGNS AND SYMPTOMS  Most signs and symptoms of metastatic brain tumors are caused by increased pressure inside your brain. A headache is often the first symptom. Eventually, almost everyone with a metastatic brain tumor has a headache. Other signs and symptoms include:  Vomiting.  Weakness or fatigue.  Seizures.  Sensory problems, like tingling or numbness.  Problems walking.  Clumsiness.  Emotional changes.  Personality changes.  Changes in speech or vision. DIAGNOSIS  Your health care provider can diagnose a metastatic brain tumor from your medical history and physical exam. You may also have some additional tests, including:  A nervous system function test (neurologic exam).  Imaging studies of your brain, such as an MRI and CT scan.  Having a small piece of tissue removed (biopsy) from your brain to be checked under a microscope. TREATMENT  Treatment depends on the type of cancer you have and your overall health. It also depends on the size of your tumor and the number of brain tumors you have. The most common treatments include:  Medicines to control pain, nausea, and seizures.  Cancer-killing drugs (chemotherapy).  An X-ray treatment called radiation therapy to kill brain tumor cells.  A type of radiation therapy that is targeted to exact locations in the brain (stereotactic radiotherapy).  Surgery to remove brain tumors. HOME CARE INSTRUCTIONS  Only take medicines as  directed by your health care provider.  Do not drive if:  You are taking strong pain medicines.  Your vision or thinking is not clear.  Take two 10-minute walks every day if you are able. Exercising is a good way to relieve stress. It can also help you sleep.  Be sure to get enough calories and protein in your diet.  If you have a poor appetite or feel nauseous, eat small meals  often.  Supplement your diet with protein shakes or milk shakes.  It is common to have anxiety and depression when you are coping with cancer.  Talk to friends and loved ones about your feelings.  Have a good support system at home. SEEK MEDICAL CARE IF:  You have chills or fever.  Your medicine is not controlling your symptoms.  Your symptoms get worse or you develop new symptoms.  You are having trouble caring for yourself or being cared for at home.  You are struggling with anxiety or depression. SEEK IMMEDIATE MEDICAL CARE IF:  You cannot stop vomiting.  You cannot keep down any foods or fluids.  You have sudden changes in speech or vision.  You have a seizure.  You can no longer take care of yourself at home.   This information is not intended to replace advice given to you by your health care provider. Make sure you discuss any questions you have with your health care provider.   Document Released: 12/21/2003 Document Revised: 04/16/2014 Document Reviewed: 03/17/2013 Elsevier Interactive Patient Education [employer].

## 2015-03-10 NOTE — ED Notes (Signed)
Pt returned from MRI °

## 2015-03-10 NOTE — Progress Notes (Signed)
Patient fell last night, complaints of a sore head, left elbow, weakened condition, decreased appetite.

## 2015-03-11 ENCOUNTER — Encounter: Payer: Self-pay | Admitting: Oncology

## 2015-03-11 NOTE — Progress Notes (Signed)
t  [place] @ [facility] Telephone:(336) [phone]  Fax:(336) [place]: 09/07/1935  MR#: [id number]  DIY#:[id number]  Patient Care Team: Adin Hector, MD as PCP - General (Internal Medicine)  CHIEF COMPLAINT:  Chief Complaint  Patient presents with  . Fall yesterday    results from MRI    Oncology History   Non-small cell carcinoma of lung T1 N2 M1stage IV diseasePET scan is consistent with one small lesion in the T11 as well as suspected lesion in liver (diagnoses was in July of 2015) right upper lobe tumor with metastases to the left liver and mediastinal lymph node  MRI of liver conforms metastases to the liver.  MRI of thoracic spine raises possibility of metastases to the spine however this is inconclusive study.  (August, 2015) 2.  Patient was started on carboplatinum and Taxol started on November 25, 2013. 6 cycle of chemotherapy with carboplatinum and Taxol.  Last treatment was March 16, 2014 7.because of progressing disease on CT scan patient has been started on NIVO   in  February of 2016 8.  Patient is off NIVO because of diarrhea (June, 2016) 7.  Will be started on carboplatinum and gemcitabine from July, 19th, 2016   8.  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  Diagnosis by MRI scan of brain October of 2016 7.  Patient had stereotactic brain radiation therapy finished in October of 2016   Oncology Flowsheet 11/24/2014 12/08/2014 12/29/2014 01/05/2015 02/09/2015 02/16/2015 03/09/2015  Day, Cycle Day 8, Cycle 2 Day 1, Cycle 3 Day 1, Cycle 4 Day 8, Cycle 4 Day 1, Cycle 5 Day 8, Cycle 5 -  CARBOplatin (PARAPLATIN) IV - 390 mg 390 mg - 390 mg - -  dexamethasone (DECADRON) IV - [ 12 mg ] [ 12 mg ] [ 10 mg ] [ 12 mg ] [ 10 mg ] [ 20 mg ]  fosaprepitant (EMEND) IV - [ 150 mg ] [ 150 mg ] - [ 150 mg ] - -  gemcitabine (GEMZAR) IV 1,000 mg/m2 1,000 mg/m2 1,000 mg/m2 1,000 mg/m2 1,000 mg/m2 988.5932 mg -  LORazepam (ATIVAN) IV - - 0.5 mg - - - -  nivolumab  (OPDIVO) IV - - - - - - -  ondansetron (ZOFRAN) IV [ 8 mg ] - - [ 8 mg ] - [ 8 mg ] [ 8 mg ]  palonosetron (ALOXI) IV - 0.25 mg 0.25 mg - 0.25 mg - -    INTERVAL HISTORY:  79 year old lady who has been diagnosed to have stage IV carcinoma off  Patient was seen yesterday with increasing headache dysphagia.  Last night patient fell backwards does not remember exactly what happened there was no witnessed seizure activity patient was taken to the emergency room MRI scan was done which revealed progressive increase in cerebellar lesions with edema Family and patient is here for further evaluation and treatment planning   REVIEW OF SYSTEMS:   GENERAL:  Feeling week tired.  Difficulty maintaining balance.  In wheelchair. PERFORMANCE STATUS (ECOG):  01 HEENT: Diplopia Lungs: No shortness of breath or cough.  No hemoptysis. Cardiac:  No chest pain, palpitations, orthopnea, or PND. GI: Diarrhea is improving Abdominal discomfort.  No blood or mucus in the stool.  Watery stool 2 or 3 a day.  No chills.  No fever. GU:  No urgency, frequency, dysuria, or hematuria. Musculoskeletal:  No back pain.  No joint pain.  No muscle tenderness. Extremities:  No pain or swelling. Skin:  No rashes or skin changes. Neurological system: Progressive symptoms as described about after taking it milligram of Decadron patient has noticed some improvement in headache Endocrine:  No diabetes, thyroid issues, hot flashes or night sweats. Psych:  No mood changes, depression or anxiety. Pain:  No focal pain. Review of systems:  All other systems reviewed and found to be negative.  As per HPI. Otherwise, a complete review of systems is negatve.  PAST MEDICAL HISTORY: Past Medical History  Diagnosis Date  . Lung cancer ([place]) non small cell  . Lung cancer ([place])     PAST SURGICAL HISTORY: No significant past surgical history other than mentioned below FAMILY HISTORY There is no significant family history of breast  cancer, ovarian cancer, colon cancer Smoking History 0.5(1)60 years(1)  PFSH: Comments: family history of colon cancer cancer of uterus and history of hypertension and heart disease  Social History: positive tobacco  Smoker: Smoking cessation counseling performed  Smoker- Packs Per Day: 0.5  Smoker- How Many Years: 2-  Additional Past Medical and Surgical History: As mentioned above         ADVANCED DIRECTIVES: Patient does not have any advanced healthcare directive. Information has been given.   HEALTH MAINTENANCE: Social History  Substance Use Topics  . Smoking status: Current Every Day Smoker -- 0.50 packs/day for 60 years  . Smokeless tobacco: None  . Alcohol Use: None       Allergies  Allergen Reactions  . Codeine Other (See Comments) and Nausea Only    GI Distress  . Hydrochlorothiazide Other (See Comments)    hypercalcemia  . Prednisone Swelling      OBJECTIVE:  Filed Vitals:   03/10/15 1349  BP: 115/71  Pulse: 99  Temp: 97.2 F (36.2 C)     There is no weight on file to calculate BMI.    ECOG FS:1 - Symptomatic but completely ambulatory  PHYSICAL EXAM: Gen. status: Patient is in wheelchair.  HEENT: Diplopia.  Continues to have difficulty swallowing.Lymphatic system: Supraclavicular, cervical, axillary, inguinal lymph nodes are not palpable.  Lungs: Diminished air entry on both sides.  Cardiac: Tachycardia  GI: No nausea no vomiting no diarrhea GU: No dysuria or hematuria Skin: No rash Neurological system: Cerebellar ataxia.  Diplopia. Musculoskeletal system within normal limit All other systems have been examined and no abnormality detected .LAB RESULTS:  Appointment on 03/09/2015  Component Date Value Ref Range Status  . Sodium 03/09/2015 133* 135 - 145 mmol/L Final  . Potassium 03/09/2015 3.1* 3.5 - 5.1 mmol/L Final  . Chloride 03/09/2015 98* 101 - 111 mmol/L Final  . CO2 03/09/2015 24  22 - 32 mmol/L Final  . Glucose, Bld 03/09/2015 99   65 - 99 mg/dL Final  . BUN 03/09/2015 8  6 - 20 mg/dL Final  . Creatinine, Ser 03/09/2015 0.81  0.44 - 1.00 mg/dL Final  . Calcium 03/09/2015 9.5  8.9 - 10.3 mg/dL Final  . Total Protein 03/09/2015 7.1  6.5 - 8.1 g/dL Final  . Albumin 03/09/2015 4.5  3.5 - 5.0 g/dL Final  . AST 03/09/2015 37  15 - 41 U/L Final  . ALT 03/09/2015 23  14 - 54 U/L Final  . Alkaline Phosphatase 03/09/2015 61  38 - 126 U/L Final  . Total Bilirubin 03/09/2015 0.6  0.3 - 1.2 mg/dL Final  . GFR calc non Af Amer 03/09/2015 >60  >60 mL/min Final  . GFR calc Af Amer 03/09/2015 >60  >  60 mL/min Final   Comment: (NOTE) The eGFR has been calculated using the CKD EPI equation. This calculation has not been validated in all clinical situations. eGFR's persistently <60 mL/min signify possible Chronic Kidney Disease.   . Anion gap 03/09/2015 11  5 - 15 Final  . WBC 03/09/2015 9.1  3.6 - 11.0 K/uL Final  . RBC 03/09/2015 3.87  3.80 - 5.20 MIL/uL Final  . Hemoglobin 03/09/2015 13.3  12.0 - 16.0 g/dL Final  . HCT 03/09/2015 38.8  35.0 - 47.0 % Final  . MCV 03/09/2015 100.3* 80.0 - 100.0 fL Final  . MCH 03/09/2015 34.3* 26.0 - 34.0 pg Final  . MCHC 03/09/2015 34.2  32.0 - 36.0 g/dL Final  . RDW 03/09/2015 15.9* 11.5 - 14.5 % Final  . Platelets 03/09/2015 540* 150 - 440 K/uL Final  . Neutrophils Relative % 03/09/2015 80   Final  . Neutro Abs 03/09/2015 7.3* 1.4 - 6.5 K/uL Final  . Lymphocytes Relative 03/09/2015 9   Final  . Lymphs Abs 03/09/2015 0.8* 1.0 - 3.6 K/uL Final  . Monocytes Relative 03/09/2015 11   Final  . Monocytes Absolute 03/09/2015 1.0* 0.2 - 0.9 K/uL Final  . Eosinophils Relative 03/09/2015 0   Final  . Eosinophils Absolute 03/09/2015 0.0  0 - 0.7 K/uL Final  . Basophils Relative 03/09/2015 0   Final  . Basophils Absolute 03/09/2015 0.0  0 - 0.1 K/uL Final      STUDIES: IMPRESSION: 1. 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 The fourth ventricle remains patent and  there is no significant intracranial mass effect at this time. 2. No other metastatic disease identified. 3. 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  Electronically Signed: By: Genevie Ann M.D. 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  I reviewed MRI scan independently And reviewed with the family.  All 3 daughters are present and had number of questions  We decided to increase steroid with Decadron 16 mg daily Seizure activity precipitating crisis cannot be ruled out we will start Keppra 1000 mg daily I requested neurosurgical opinion from [facility] family is slightly reluctant about neurosurgical approach We will obtain radiation oncologist's opinion on Monday  Family was advised to provide ound the clock observattion  Duration of visit is50  minutes and 50% of time was spent discussing VARIOUS  options or alleviating care with other physicians social worker        Cancer of lung, upper lobe   Staging form: Lung, AJCC 7th Edition     Clinical: Stage IV (T1, N2, M1b) - Marni Griffon, MD   03/11/2015 7:26 AM

## 2015-03-14 ENCOUNTER — Ambulatory Visit: Payer: Medicare Other | Admitting: Oncology

## 2015-03-15 ENCOUNTER — Other Ambulatory Visit: Payer: Self-pay | Admitting: *Deleted

## 2015-03-15 ENCOUNTER — Ambulatory Visit
Admission: RE | Admit: 2015-03-15 | Discharge: 2015-03-15 | Disposition: A | Discharge status: Home / Self Care | Payer: Medicare Other | Source: Ambulatory Visit | Attending: Radiation Oncology | Admitting: Radiation Oncology

## 2015-03-15 ENCOUNTER — Inpatient Hospital Stay (HOSPITAL_BASED_OUTPATIENT_CLINIC_OR_DEPARTMENT_OTHER): Payer: Medicare Other | Admitting: Oncology

## 2015-03-15 ENCOUNTER — Encounter: Payer: Self-pay | Admitting: Radiation Oncology

## 2015-03-15 ENCOUNTER — Encounter: Payer: Self-pay | Admitting: Oncology

## 2015-03-15 VITALS — BP 146/93 | HR 95 | Temp 96.3°F | Resp 18 | Wt 154.2 lb

## 2015-03-15 DIAGNOSIS — R531 Weakness: Secondary | ICD-10-CM

## 2015-03-15 DIAGNOSIS — C771 Secondary and unspecified malignant neoplasm of intrathoracic lymph nodes: Secondary | ICD-10-CM | POA: Diagnosis not present

## 2015-03-15 DIAGNOSIS — R439 Unspecified disturbances of smell and taste: Secondary | ICD-10-CM

## 2015-03-15 DIAGNOSIS — Z923 Personal history of irradiation: Secondary | ICD-10-CM

## 2015-03-15 DIAGNOSIS — C3410 Malignant neoplasm of upper lobe, unspecified bronchus or lung: Secondary | ICD-10-CM

## 2015-03-15 DIAGNOSIS — R5383 Other fatigue: Secondary | ICD-10-CM

## 2015-03-15 DIAGNOSIS — R2689 Other abnormalities of gait and mobility: Secondary | ICD-10-CM

## 2015-03-15 DIAGNOSIS — G936 Cerebral edema: Secondary | ICD-10-CM

## 2015-03-15 DIAGNOSIS — B370 Candidal stomatitis: Secondary | ICD-10-CM

## 2015-03-15 DIAGNOSIS — C7931 Secondary malignant neoplasm of brain: Secondary | ICD-10-CM | POA: Diagnosis not present

## 2015-03-15 DIAGNOSIS — C3411 Malignant neoplasm of upper lobe, right bronchus or lung: Secondary | ICD-10-CM | POA: Diagnosis not present

## 2015-03-15 DIAGNOSIS — Z9221 Personal history of antineoplastic chemotherapy: Secondary | ICD-10-CM

## 2015-03-15 DIAGNOSIS — C787 Secondary malignant neoplasm of liver and intrahepatic bile duct: Secondary | ICD-10-CM | POA: Diagnosis not present

## 2015-03-15 MED ORDER — LEVETIRACETAM 500 MG PO TABS: 1000.0000 mg | ORAL_TABLET | Freq: Every day | ORAL | Status: DC

## 2015-03-15 MED ORDER — DEXAMETHASONE 4 MG PO TABS: ORAL_TABLET | ORAL | Status: DC

## 2015-03-15 MED ORDER — FLUCONAZOLE 100 MG PO TABS: 100.0000 mg | ORAL_TABLET | Freq: Every day | ORAL | Status: DC

## 2015-03-15 NOTE — Progress Notes (Signed)
t  [place] @ [facility] Telephone:(336) [phone]  Fax:(336) [place]: [birth year]  MR#: [id number]  OMA#:[id number]  Patient Care Team: Adin Hector, MD as PCP - General (Internal Medicine)  CHIEF COMPLAINT:  No chief complaint on file.   Oncology History   Non-small cell carcinoma of lung T1 N2 M1stage IV diseasePET scan is consistent with one small lesion in the T11 as well as suspected lesion in liver (diagnoses was in July of 2015) right upper lobe tumor with metastases to the left liver and mediastinal lymph node  MRI of liver conforms metastases to the liver.  MRI of thoracic spine raises possibility of metastases to the spine however this is inconclusive study.  (August, 2015) 2.  Patient was started on carboplatinum and Taxol started on November 25, 2013. 6 cycle of chemotherapy with carboplatinum and Taxol.  Last treatment was March 16, 2014 7.because of progressing disease on CT scan patient has been started on NIVO   in  February of 2016 8.  Patient is off NIVO because of diarrhea (June, 2016) 7.  Will be started on carboplatinum and gemcitabine from July, 19th, 2016   8.  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  Diagnosis by MRI scan of brain October of 2016 7.  Patient had stereotactic brain radiation therapy finished in October of 2016   Oncology Flowsheet 11/24/2014 12/08/2014 12/29/2014 01/05/2015 02/09/2015 02/16/2015 03/09/2015  Day, Cycle Day 8, Cycle 2 Day 1, Cycle 3 Day 1, Cycle 4 Day 8, Cycle 4 Day 1, Cycle 5 Day 8, Cycle 5 -  CARBOplatin (PARAPLATIN) IV - 390 mg 390 mg - 390 mg - -  dexamethasone (DECADRON) IV - [ 12 mg ] [ 12 mg ] [ 10 mg ] [ 12 mg ] [ 10 mg ] [ 20 mg ]  fosaprepitant (EMEND) IV - [ 150 mg ] [ 150 mg ] - [ 150 mg ] - -  gemcitabine (GEMZAR) IV 1,000 mg/m2 1,000 mg/m2 1,000 mg/m2 1,000 mg/m2 1,000 mg/m2 988.5932 mg -  LORazepam (ATIVAN) IV - - 0.5 mg - - - -  nivolumab (OPDIVO) IV - - - - - - -  ondansetron (ZOFRAN) IV [ 8  mg ] - - [ 8 mg ] - [ 8 mg ] [ 8 mg ]  palonosetron (ALOXI) IV - 0.25 mg 0.25 mg - 0.25 mg - -    INTERVAL HISTORY:  79 year old lady who has been diagnosed to have stage IV carcinoma off  Patient's condition is somewhat better. Still walking with the help of walker.  She does not have any taste in the mouth.  She was diagnosed to have candidiasis and was started on Diflucan.  Patient is taking 16 mg of Decadron and 1000 mg of Keppra.  She is sleeping all the time. No more seizure activity.  No headache.  Diplopia is better.  Balance is improved.   REVIEW OF SYSTEMS:   GENERAL:  Feeling week tired.  Difficulty maintaining balance.  In wheelchair. PERFORMANCE STATUS (ECOG):  01 HEENT: Diplopia Lungs: No shortness of breath or cough.  No hemoptysis. Cardiac:  No chest pain, palpitations, orthopnea, or PND. GI: Diarrhea is improving Abdominal discomfort.  No blood or mucus in the stool.  Watery stool 2 or 3 a day.  No chills.  No fever. GU:  No urgency, frequency, dysuria, or hematuria. Musculoskeletal:  No back pain.  No joint pain.  No muscle tenderness. Extremities:  No pain or swelling. Skin:  No rashes or skin changes. Neurological system: Progressive symptoms as described about after taking it milligram of Decadron patient has noticed some improvement in headache Endocrine:  No diabetes, thyroid issues, hot flashes or night sweats. Psych:  No mood changes, depression or anxiety. Pain:  No focal pain. Review of systems:  All other systems reviewed and found to be negative.  As per HPI. Otherwise, a complete review of systems is negatve.  PAST MEDICAL HISTORY: Past Medical History  Diagnosis Date  . Lung cancer ([place]) non small cell  . Lung cancer ([place])     PAST SURGICAL HISTORY: No significant past surgical history other than mentioned below FAMILY HISTORY There is no significant family history of breast cancer, ovarian cancer, colon cancer Smoking History 0.5(1)60  years(1)  PFSH: Comments: family history of colon cancer cancer of uterus and history of hypertension and heart disease  Social History: positive tobacco  Smoker: Smoking cessation counseling performed  Smoker- Packs Per Day: 0.5  Smoker- How Many Years: 59-  Additional Past Medical and Surgical History: As mentioned above         ADVANCED DIRECTIVES: Patient does not have any advanced healthcare directive. Information has been given.   HEALTH MAINTENANCE: Social History  Substance Use Topics  . Smoking status: Current Every Day Smoker -- 0.50 packs/day for 60 years  . Smokeless tobacco: None  . Alcohol Use: None       Allergies  Allergen Reactions  . Codeine Other (See Comments) and Nausea Only    GI Distress  . Hydrochlorothiazide Other (See Comments)    hypercalcemia  . Prednisone Swelling      OBJECTIVE:  There were no vitals filed for this visit.   There is no weight on file to calculate BMI.    ECOG FS:1 - Symptomatic but completely ambulatory  PHYSICAL EXAM: Gen. status: Patient is in wheelchair.  HEENT: Diplopia.  Continues to have difficulty swallowing.Lymphatic system: Supraclavicular, cervical, axillary, inguinal lymph nodes are not palpable.  Lungs: Diminished air entry on both sides.  Cardiac: Tachycardia  GI: No nausea no vomiting no diarrhea GU: No dysuria or hematuria Skin: No rash Neurological system: Cerebellar ataxia.  Diplopia. Musculoskeletal system within normal limit All other systems have been examined and no abnormality detected .LAB RESULTS:  No visits with results within 3 Day(s) from this visit. Latest known visit with results is:  Appointment on 03/09/2015  Component Date Value Ref Range Status  . Sodium 03/09/2015 133* 135 - 145 mmol/L Final  . Potassium 03/09/2015 3.1* 3.5 - 5.1 mmol/L Final  . Chloride 03/09/2015 98* 101 - 111 mmol/L Final  . CO2 03/09/2015 24  22 - 32 mmol/L Final  . Glucose, Bld 03/09/2015 99  65 - 99  mg/dL Final  . BUN 03/09/2015 8  6 - 20 mg/dL Final  . Creatinine, Ser 03/09/2015 0.81  0.44 - 1.00 mg/dL Final  . Calcium 03/09/2015 9.5  8.9 - 10.3 mg/dL Final  . Total Protein 03/09/2015 7.1  6.5 - 8.1 g/dL Final  . Albumin 03/09/2015 4.5  3.5 - 5.0 g/dL Final  . AST 03/09/2015 37  15 - 41 U/L Final  . ALT 03/09/2015 23  14 - 54 U/L Final  . Alkaline Phosphatase 03/09/2015 61  38 - 126 U/L Final  . Total Bilirubin 03/09/2015 0.6  0.3 - 1.2 mg/dL Final  . GFR calc non Af Amer 03/09/2015 >60  >60 mL/min Final  . GFR calc  Af Amer 03/09/2015 >60  >60 mL/min Final   Comment: (NOTE) The eGFR has been calculated using the CKD EPI equation. This calculation has not been validated in all clinical situations. eGFR's persistently <60 mL/min signify possible Chronic Kidney Disease.   . Anion gap 03/09/2015 11  5 - 15 Final  . WBC 03/09/2015 9.1  3.6 - 11.0 K/uL Final  . RBC 03/09/2015 3.87  3.80 - 5.20 MIL/uL Final  . Hemoglobin 03/09/2015 13.3  12.0 - 16.0 g/dL Final  . HCT 03/09/2015 38.8  35.0 - 47.0 % Final  . MCV 03/09/2015 100.3* 80.0 - 100.0 fL Final  . MCH 03/09/2015 34.3* 26.0 - 34.0 pg Final  . MCHC 03/09/2015 34.2  32.0 - 36.0 g/dL Final  . RDW 03/09/2015 15.9* 11.5 - 14.5 % Final  . Platelets 03/09/2015 540* 150 - 440 K/uL Final  . Neutrophils Relative % 03/09/2015 80   Final  . Neutro Abs 03/09/2015 7.3* 1.4 - 6.5 K/uL Final  . Lymphocytes Relative 03/09/2015 9   Final  . Lymphs Abs 03/09/2015 0.8* 1.0 - 3.6 K/uL Final  . Monocytes Relative 03/09/2015 11   Final  . Monocytes Absolute 03/09/2015 1.0* 0.2 - 0.9 K/uL Final  . Eosinophils Relative 03/09/2015 0   Final  . Eosinophils Absolute 03/09/2015 0.0  0 - 0.7 K/uL Final  . Basophils Relative 03/09/2015 0   Final  . Basophils Absolute 03/09/2015 0.0  0 - 0.1 K/uL Final      STUDIES: IMPRESSION: 1. 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 The fourth ventricle remains patent and there is  no significant intracranial mass effect at this time. 2. No other metastatic disease identified. 3. 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  Electronically Signed: By: Genevie Ann M.D. 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  Establishing contact the neurosurgeon at [facility] and we are still waiting for the report to come back. Will start patient on Decadron 4 mg 4 times a day for one week and taper by one tablet every week.  Patient will be evaluated in 3 weeks. If needed physiotherapy would be done. Chemotherapy till patient's neurological symptom improves.  Continue Keppra.        Cancer of lung, upper lobe   Staging form: Lung, AJCC 7th Edition     Clinical: Stage IV (T1, N2, M1b) - Marni Griffon, MD   03/15/2015 12:01 PM

## 2015-03-15 NOTE — Progress Notes (Signed)
Radiation Oncology Follow up Note  Name: Jill Richards   Date:   03/15/2015 MRN:  [id number] DOB: 28-Apr-1935    This 79 y.o. female presents to the clinic today for follow-up for brain stem metastasis from stage IV lung cancer status post radiation therapy.  REFERRING PROVIDER: Forest Gleason, MD  HPI: Patient is a 79 year old female now out approximately 2 months having completed radiation therapy to her brain stem for 1.3 cm lesion presumed from stage IV lung cancer diagnosed in July 2015. She has known T11 as well as liver metastasis. Medical oncology asked to see the patient she had what is presumed to be a seizure felt quite weak and lethargic and was significant nauseated and had no appetite. She's been restarted on her steroids and is feeling improved. She does have some dysphasia and has obvious oral candidiasis.. Repeat MRI scan shows persistent area of involvement in the brainstem slightly increased in size on my review.  COMPLICATIONS OF TREATMENT: none  FOLLOW UP COMPLIANCE: keeps appointments   PHYSICAL EXAM:  BP 146/93 mmHg  Pulse 95  Temp(Src) 96.3 F (35.7 C)  Resp 18  Wt 154 lb 3.4 oz (69.95 kg) Wheelchair-bound female in NAD crude visual fields were in normal range. Motor sensory and DTR levels are equal and symmetric in upper lower extremities. Cranial nerves II through XII are grossly intact. Proprioception is intact. She does have obvious oral candidiasis. Well-developed well-nourished patient in NAD. HEENT reveals PERLA, EOMI, discs not visualized.  Oral cavity is clear. No oral mucosal lesions are identified. Neck is clear without evidence of cervical or supraclavicular adenopathy. Lungs are clear to A&P. Cardiac examination is essentially unremarkable with regular rate and rhythm without murmur rub or thrill. Abdomen is benign with no organomegaly or masses noted. Motor sensory and DTR levels are equal and symmetric in the upper and lower extremities. Cranial  nerves II through XII are grossly intact. Proprioception is intact. No peripheral adenopathy or edema is identified. No motor or sensory levels are noted. Crude visual fields are within normal range.  RADIOLOGY RESULTS: MRI scan is reviewed compatible above-stated findings.  PLAN: At this time I believe there is been only minimal increase in size in the brainstem lesion. Not that significantly concerned about that. I will continue her on her steroids as they are now I have started her on Diflucan for 5 days for oral candidiasis. Otherwise she continues close follow-up care with medical oncology. Her other significant metastatic disease may be Triggering factor in her overall decline. Will follow-up with her in 2 weeks.  I would like to take this opportunity for allowing me to participate in the care of your patient.Armstead Peaks., MD

## 2015-03-30 ENCOUNTER — Encounter: Payer: Self-pay | Admitting: Oncology

## 2015-03-30 ENCOUNTER — Inpatient Hospital Stay (HOSPITAL_BASED_OUTPATIENT_CLINIC_OR_DEPARTMENT_OTHER): Payer: Medicare Other | Admitting: Oncology

## 2015-03-30 ENCOUNTER — Ambulatory Visit: Payer: Medicare Other | Admitting: Radiation Oncology

## 2015-03-30 ENCOUNTER — Inpatient Hospital Stay: Payer: Medicare Other

## 2015-03-30 VITALS — BP 108/66 | HR 92 | Temp 95.7°F | Resp 18 | Wt 151.4 lb

## 2015-03-30 DIAGNOSIS — R51 Headache: Secondary | ICD-10-CM

## 2015-03-30 DIAGNOSIS — Z923 Personal history of irradiation: Secondary | ICD-10-CM

## 2015-03-30 DIAGNOSIS — C7931 Secondary malignant neoplasm of brain: Secondary | ICD-10-CM | POA: Diagnosis not present

## 2015-03-30 DIAGNOSIS — Z9221 Personal history of antineoplastic chemotherapy: Secondary | ICD-10-CM

## 2015-03-30 DIAGNOSIS — C787 Secondary malignant neoplasm of liver and intrahepatic bile duct: Secondary | ICD-10-CM

## 2015-03-30 DIAGNOSIS — G939 Disorder of brain, unspecified: Secondary | ICD-10-CM

## 2015-03-30 DIAGNOSIS — H532 Diplopia: Secondary | ICD-10-CM

## 2015-03-30 DIAGNOSIS — C771 Secondary and unspecified malignant neoplasm of intrathoracic lymph nodes: Secondary | ICD-10-CM

## 2015-03-30 DIAGNOSIS — C3411 Malignant neoplasm of upper lobe, right bronchus or lung: Secondary | ICD-10-CM | POA: Diagnosis not present

## 2015-03-30 DIAGNOSIS — R1310 Dysphagia, unspecified: Secondary | ICD-10-CM

## 2015-03-30 DIAGNOSIS — R197 Diarrhea, unspecified: Secondary | ICD-10-CM

## 2015-03-30 DIAGNOSIS — G936 Cerebral edema: Secondary | ICD-10-CM

## 2015-03-30 DIAGNOSIS — C3410 Malignant neoplasm of upper lobe, unspecified bronchus or lung: Secondary | ICD-10-CM

## 2015-03-30 DIAGNOSIS — R270 Ataxia, unspecified: Secondary | ICD-10-CM

## 2015-03-30 LAB — COMPREHENSIVE METABOLIC PANEL
ALBUMIN: 3.6 g/dL (ref 3.5–5.0)
ALT: 26 U/L (ref 14–54)
ANION GAP: 11 (ref 5–15)
AST: 25 U/L (ref 15–41)
Alkaline Phosphatase: 66 U/L (ref 38–126)
BILIRUBIN TOTAL: 0.7 mg/dL (ref 0.3–1.2)
BUN: 22 mg/dL — ABNORMAL HIGH (ref 6–20)
CALCIUM: 9.1 mg/dL (ref 8.9–10.3)
CO2: 29 mmol/L (ref 22–32)
Chloride: 94 mmol/L — ABNORMAL LOW (ref 101–111)
Creatinine, Ser: 0.69 mg/dL (ref 0.44–1.00)
GLUCOSE: 109 mg/dL — AB (ref 65–99)
POTASSIUM: 3.8 mmol/L (ref 3.5–5.1)
Sodium: 134 mmol/L — ABNORMAL LOW (ref 135–145)
TOTAL PROTEIN: 6.3 g/dL — AB (ref 6.5–8.1)

## 2015-03-30 LAB — CBC WITH DIFFERENTIAL/PLATELET
BASOS PCT: 1 %
Basophils Absolute: 0.1 10*3/uL (ref 0–0.1)
Eosinophils Absolute: 0 10*3/uL (ref 0–0.7)
Eosinophils Relative: 0 %
HEMATOCRIT: 44.7 % (ref 35.0–47.0)
Hemoglobin: 14.9 g/dL (ref 12.0–16.0)
LYMPHS ABS: 0.1 10*3/uL — AB (ref 1.0–3.6)
Lymphocytes Relative: 1 %
MCH: 32.9 pg (ref 26.0–34.0)
MCHC: 33.3 g/dL (ref 32.0–36.0)
MCV: 98.9 fL (ref 80.0–100.0)
MONO ABS: 0.3 10*3/uL (ref 0.2–0.9)
MONOS PCT: 2 %
NEUTROS ABS: 21.5 10*3/uL — AB (ref 1.4–6.5)
Neutrophils Relative %: 96 %
Platelets: 143 10*3/uL — ABNORMAL LOW (ref 150–440)
RBC: 4.52 MIL/uL (ref 3.80–5.20)
RDW: 16 % — AB (ref 11.5–14.5)
WBC: 22.1 10*3/uL — ABNORMAL HIGH (ref 3.6–11.0)

## 2015-03-30 LAB — MAGNESIUM: MAGNESIUM: 1.8 mg/dL (ref 1.7–2.4)

## 2015-03-30 NOTE — Progress Notes (Signed)
Patient states the feeling is coming back in her right jaw and she is experiencing pain when she opens her mouth.

## 2015-04-05 ENCOUNTER — Encounter: Payer: Self-pay | Admitting: Oncology

## 2015-04-05 NOTE — Progress Notes (Signed)
t  [place] @ [facility] Telephone:(336) [phone]  Fax:(336) [place]: 1935-05-14  MR#: [id number]  KGS#:[id number]  Patient Care Team: Adin Hector, MD as PCP - General (Internal Medicine)  CHIEF COMPLAINT:  Chief Complaint  Patient presents with  . Lung Cancer    Oncology History   Non-small cell carcinoma of lung T1 N2 M1stage IV diseasePET scan is consistent with one small lesion in the T11 as well as suspected lesion in liver (diagnoses was in July of 2015) right upper lobe tumor with metastases to the left liver and mediastinal lymph node  MRI of liver conforms metastases to the liver.  MRI of thoracic spine raises possibility of metastases to the spine however this is inconclusive study.  (August, 2015) 2.  Patient was started on carboplatinum and Taxol started on November 25, 2013. 6 cycle of chemotherapy with carboplatinum and Taxol.  Last treatment was March 16, 2014 7.because of progressing disease on CT scan patient has been started on NIVO   in  February of 2016 8.  Patient is off NIVO because of diarrhea (June, 2016) 7.  Will be started on carboplatinum and gemcitabine from July, 19th, 2016   8.  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  Diagnosis by MRI scan of brain October of 2016 7.  Patient had stereotactic brain radiation therapy finished in October of 2016   Oncology Flowsheet 11/24/2014 12/08/2014 12/29/2014 01/05/2015 02/09/2015 02/16/2015 03/09/2015  Day, Cycle Day 8, Cycle 2 Day 1, Cycle 3 Day 1, Cycle 4 Day 8, Cycle 4 Day 1, Cycle 5 Day 8, Cycle 5 -  CARBOplatin (PARAPLATIN) IV - 390 mg 390 mg - 390 mg - -  dexamethasone (DECADRON) IV - [ 12 mg ] [ 12 mg ] [ 10 mg ] [ 12 mg ] [ 10 mg ] [ 20 mg ]  fosaprepitant (EMEND) IV - [ 150 mg ] [ 150 mg ] - [ 150 mg ] - -  gemcitabine (GEMZAR) IV 1,000 mg/m2 1,000 mg/m2 1,000 mg/m2 1,000 mg/m2 1,000 mg/m2 988.5932 mg -  LORazepam (ATIVAN) IV - - 0.5 mg - - - -  nivolumab (OPDIVO) IV - - - - - - -    ondansetron (ZOFRAN) IV [ 8 mg ] - - [ 8 mg ] - [ 8 mg ] [ 8 mg ]  palonosetron (ALOXI) IV - 0.25 mg 0.25 mg - 0.25 mg - -    INTERVAL HISTORY:  79 year old lady who has been diagnosed to have stage IV carcinoma of lung with metastases to the brain She continues to have diplopia and difficulty maintaining balance. Recent MRI revealed vasogenic edema in the brainstem area at the previous site of radiation therapy On steroid.  Decadron 8 mg daily    REVIEW OF SYSTEMS:   GENERAL:  Feeling week tired.  Difficulty maintaining balance.  In wheelchair. PERFORMANCE STATUS (ECOG):  01 HEENT: Diplopia Lungs: No shortness of breath or cough.  No hemoptysis. Cardiac:  No chest pain, palpitations, orthopnea, or PND. GI: Diarrhea is improving Abdominal discomfort.  No blood or mucus in the stool.  Watery stool 2 or 3 a day.  No chills.  No fever. GU:  No urgency, frequency, dysuria, or hematuria. Musculoskeletal:  No back pain.  No joint pain.  No muscle tenderness. Extremities:  No pain or swelling. Skin:  No rashes or skin changes. Neurological system: Progressive symptoms as described about after taking it milligram of Decadron  patient has noticed some improvement in headache Endocrine:  No diabetes, thyroid issues, hot flashes or night sweats. Psych:  No mood changes, depression or anxiety. Pain:  No focal pain. Review of systems:  All other systems reviewed and found to be negative.  As per HPI. Otherwise, a complete review of systems is negatve.  PAST MEDICAL HISTORY: Past Medical History  Diagnosis Date  . Lung cancer ([place]) non small cell  . Lung cancer ([place])     PAST SURGICAL HISTORY: No significant past surgical history other than mentioned below FAMILY HISTORY There is no significant family history of breast cancer, ovarian cancer, colon cancer Smoking History 0.5(1)60 years(1)  PFSH: Comments: family history of colon cancer cancer of uterus and history of hypertension and  heart disease  Social History: positive tobacco  Smoker: Smoking cessation counseling performed  Smoker- Packs Per Day: 0.5  Smoker- How Many Years: 60-  Additional Past Medical and Surgical History: As mentioned above         ADVANCED DIRECTIVES: Patient does not have any advanced healthcare directive. Information has been given.   HEALTH MAINTENANCE: Social History  Substance Use Topics  . Smoking status: Current Every Day Smoker -- 0.50 packs/day for 60 years  . Smokeless tobacco: None  . Alcohol Use: None       Allergies  Allergen Reactions  . Codeine Other (See Comments) and Nausea Only    GI Distress  . Hydrochlorothiazide Other (See Comments)    hypercalcemia  . Prednisone Swelling      OBJECTIVE:  Filed Vitals:   03/30/15 1053  BP: 108/66  Pulse: 92  Temp: 95.7 F (35.4 C)  Resp: 18     Body mass index is 26.82 kg/(m^2).    ECOG FS:1 - Symptomatic but completely ambulatory  PHYSICAL EXAM: Gen. status: Patient is in wheelchair.  HEENT: Diplopia.  Continues to have difficulty swallowing.Lymphatic system: Supraclavicular, cervical, axillary, inguinal lymph nodes are not palpable.  Lungs: Diminished air entry on both sides.  Cardiac: Tachycardia  GI: No nausea no vomiting no diarrhea GU: No dysuria or hematuria Skin: No rash Neurological system: Cerebellar ataxia.  Diplopia. Musculoskeletal system within normal limit All other systems have been examined and no abnormality detected .LAB RESULTS:  Appointment on 03/30/2015  Component Date Value Ref Range Status  . WBC 03/30/2015 22.1* 3.6 - 11.0 K/uL Final  . RBC 03/30/2015 4.52  3.80 - 5.20 MIL/uL Final  . Hemoglobin 03/30/2015 14.9  12.0 - 16.0 g/dL Final  . HCT 03/30/2015 44.7  35.0 - 47.0 % Final  . MCV 03/30/2015 98.9  80.0 - 100.0 fL Final  . MCH 03/30/2015 32.9  26.0 - 34.0 pg Final  . MCHC 03/30/2015 33.3  32.0 - 36.0 g/dL Final  . RDW 03/30/2015 16.0* 11.5 - 14.5 % Final  . Platelets  03/30/2015 143* 150 - 440 K/uL Final  . Neutrophils Relative % 03/30/2015 96   Final  . Neutro Abs 03/30/2015 21.5* 1.4 - 6.5 K/uL Final  . Lymphocytes Relative 03/30/2015 1   Final  . Lymphs Abs 03/30/2015 0.1* 1.0 - 3.6 K/uL Final  . Monocytes Relative 03/30/2015 2   Final  . Monocytes Absolute 03/30/2015 0.3  0.2 - 0.9 K/uL Final  . Eosinophils Relative 03/30/2015 0   Final  . Eosinophils Absolute 03/30/2015 0.0  0 - 0.7 K/uL Final  . Basophils Relative 03/30/2015 1   Final  . Basophils Absolute 03/30/2015 0.1  0 - 0.1 K/uL Final  .  Sodium 03/30/2015 134* 135 - 145 mmol/L Final  . Potassium 03/30/2015 3.8  3.5 - 5.1 mmol/L Final  . Chloride 03/30/2015 94* 101 - 111 mmol/L Final  . CO2 03/30/2015 29  22 - 32 mmol/L Final  . Glucose, Bld 03/30/2015 109* 65 - 99 mg/dL Final  . BUN 03/30/2015 22* 6 - 20 mg/dL Final  . Creatinine, Ser 03/30/2015 0.69  0.44 - 1.00 mg/dL Final  . Calcium 03/30/2015 9.1  8.9 - 10.3 mg/dL Final  . Total Protein 03/30/2015 6.3* 6.5 - 8.1 g/dL Final  . Albumin 03/30/2015 3.6  3.5 - 5.0 g/dL Final  . AST 03/30/2015 25  15 - 41 U/L Final  . ALT 03/30/2015 26  14 - 54 U/L Final  . Alkaline Phosphatase 03/30/2015 66  38 - 126 U/L Final  . Total Bilirubin 03/30/2015 0.7  0.3 - 1.2 mg/dL Final  . GFR calc non Af Amer 03/30/2015 >60  >60 mL/min Final  . GFR calc Af Amer 03/30/2015 >60  >60 mL/min Final   Comment: (NOTE) The eGFR has been calculated using the CKD EPI equation. This calculation has not been validated in all clinical situations. eGFR's persistently <60 mL/min signify possible Chronic Kidney Disease.   . Anion gap 03/30/2015 11  5 - 15 Final  . Magnesium 03/30/2015 1.8  1.7 - 2.4 mg/dL Final      STUDIES: IMPRESSION: 1. 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 The fourth ventricle remains patent and there is no significant intracranial mass effect at this time. 2. No other metastatic disease identified. 3.  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  Electronically Signed: By: Genevie Ann M.D. 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  reevaluation after repeating MRI scan        Cancer of lung, upper lobe   Staging form: Lung, AJCC 7th Edition     Clinical: Stage IV (T1, N2, M1b) - Marni Griffon, MD   04/05/2015 9:15 AM

## 2015-04-06 ENCOUNTER — Encounter: Payer: Self-pay | Admitting: Radiation Oncology

## 2015-04-06 ENCOUNTER — Ambulatory Visit
Admission: RE | Admit: 2015-04-06 | Discharge: 2015-04-06 | Disposition: A | Discharge status: Home / Self Care | Payer: Medicare Other | Source: Ambulatory Visit | Attending: Radiation Oncology | Admitting: Radiation Oncology

## 2015-04-06 ENCOUNTER — Telehealth: Payer: Self-pay | Admitting: *Deleted

## 2015-04-06 VITALS — BP 135/80 | HR 95 | Temp 96.7°F

## 2015-04-06 DIAGNOSIS — G939 Disorder of brain, unspecified: Secondary | ICD-10-CM

## 2015-04-06 DIAGNOSIS — C787 Secondary malignant neoplasm of liver and intrahepatic bile duct: Secondary | ICD-10-CM

## 2015-04-06 DIAGNOSIS — C3410 Malignant neoplasm of upper lobe, unspecified bronchus or lung: Secondary | ICD-10-CM

## 2015-04-06 MED ORDER — FUROSEMIDE 20 MG PO TABS: 20.0000 mg | ORAL_TABLET | Freq: Every day | ORAL | Status: DC

## 2015-04-06 NOTE — Progress Notes (Signed)
Radiation Oncology Follow up Note  Name: Jill Richards   Date:   04/06/2015 MRN:  [id number] DOB: 10-Nov-1935    This 79 y.o. female presents to the clinic today for follow-up stage IV lung cancer status post brainstem metastasis receiving radiation therapy.  REFERRING PROVIDER: Forest Gleason, MD  HPI: Patient is a 79 year old female now out proximal me to half months having completed radiation therapy to her brain stem for a 1.3 cm lesion from presumed stage IV lung cancer diagnosed in July 2015. She has known mediastinal as well as liver metastasis. She had a recent MRI scan showing persistent area involvement in the brainstem slightly increased in size. She was having significant diplopia has had prism glasses made in the diplopia is fine at this point. She's quite weak. Also having some minus swallowing difficulty. No areas of pain are noted. She scheduled for follow-up MRI scan of her brain in the next 2 weeks followed by an appointment with medical oncology..  COMPLICATIONS OF TREATMENT: none  FOLLOW UP COMPLIANCE: keeps appointments   PHYSICAL EXAM:  BP 135/80 mmHg  Pulse 95  Temp(Src) 96.7 F (35.9 C) Well-developed patient in NAD wheelchair-bound looking rather frail. Crude visual fields are within normal range. Motor sensory and DTR levels are equal and symmetric in upper lower extremities cranial nerves II through XII are grossly intact. Patient has obvious oral candidiasis Well-developed well-nourished patient in NAD. HEENT reveals PERLA, EOMI, discs not visualized.  Oral cavity is clear. No oral mucosal lesions are identified. Neck is clear without evidence of cervical or supraclavicular adenopathy. Lungs are clear to A&P. Cardiac examination is essentially unremarkable with regular rate and rhythm without murmur rub or thrill. Abdomen is benign with no organomegaly or masses noted. Motor sensory and DTR levels are equal and symmetric in the upper and lower extremities.  Cranial nerves II through XII are grossly intact. Proprioception is intact. No peripheral adenopathy or edema is identified. No motor or sensory levels are noted. Crude visual fields are within normal range.  RADIOLOGY RESULTS: MRI scans again reviewed. Will review her new scan when it becomes available in 2 weeks  PLAN: At this time patient is stable she does have oral candidiasis am restarting her on Diflucan. She continues on steroid therapy low dose. She is scheduled for follow-up MRI scan 2 weeks I will review that with medical oncology when it becomes available. At this time I'm going to turn follow-up care over to medical oncology. I believe some of her decline is certainly due to her extensive metastatic disease. This was explained to the family.  I would like to take this opportunity for allowing me to participate in the care of your patient.Armstead Peaks., MD

## 2015-04-06 NOTE — Telephone Encounter (Signed)
Refill for lasix escribed to pharmacy.

## 2015-04-07 DIAGNOSIS — C7931 Secondary malignant neoplasm of brain: Secondary | ICD-10-CM | POA: Insufficient documentation

## 2015-04-07 DIAGNOSIS — G40309 Generalized idiopathic epilepsy and epileptic syndromes, not intractable, without status epilepticus: Secondary | ICD-10-CM | POA: Insufficient documentation

## 2015-04-20 ENCOUNTER — Other Ambulatory Visit: Payer: Medicare Other

## 2015-04-20 ENCOUNTER — Ambulatory Visit: Payer: Medicare Other | Admitting: Oncology

## 2015-04-21 ENCOUNTER — Ambulatory Visit
Admission: RE | Admit: 2015-04-21 | Discharge: 2015-04-21 | Disposition: A | Discharge status: Home / Self Care | Payer: Medicare Other | Source: Ambulatory Visit | Attending: Oncology | Admitting: Oncology

## 2015-04-21 DIAGNOSIS — C3410 Malignant neoplasm of upper lobe, unspecified bronchus or lung: Secondary | ICD-10-CM | POA: Insufficient documentation

## 2015-04-21 DIAGNOSIS — G939 Disorder of brain, unspecified: Secondary | ICD-10-CM | POA: Diagnosis present

## 2015-04-21 DIAGNOSIS — C7931 Secondary malignant neoplasm of brain: Secondary | ICD-10-CM | POA: Insufficient documentation

## 2015-04-21 DIAGNOSIS — K116 Mucocele of salivary gland: Secondary | ICD-10-CM | POA: Diagnosis not present

## 2015-04-21 MED ORDER — GADOBENATE DIMEGLUMINE 529 MG/ML IV SOLN
15.0000 mL | Freq: Once | INTRAVENOUS | Status: AC | PRN
  Administered 2015-04-21: 14 mL via INTRAVENOUS

## 2015-04-25 ENCOUNTER — Encounter: Payer: Self-pay | Admitting: Oncology

## 2015-04-25 ENCOUNTER — Inpatient Hospital Stay: Payer: Medicare Other | Attending: Oncology

## 2015-04-25 ENCOUNTER — Inpatient Hospital Stay: Payer: Medicare Other

## 2015-04-25 ENCOUNTER — Inpatient Hospital Stay (HOSPITAL_BASED_OUTPATIENT_CLINIC_OR_DEPARTMENT_OTHER): Payer: Medicare Other | Admitting: Oncology

## 2015-04-25 VITALS — BP 112/68 | HR 88 | Temp 95.7°F | Resp 18 | Wt 155.6 lb

## 2015-04-25 DIAGNOSIS — H532 Diplopia: Secondary | ICD-10-CM | POA: Insufficient documentation

## 2015-04-25 DIAGNOSIS — Z7952 Long term (current) use of systemic steroids: Secondary | ICD-10-CM | POA: Insufficient documentation

## 2015-04-25 DIAGNOSIS — C7931 Secondary malignant neoplasm of brain: Secondary | ICD-10-CM | POA: Diagnosis not present

## 2015-04-25 DIAGNOSIS — R5383 Other fatigue: Secondary | ICD-10-CM | POA: Insufficient documentation

## 2015-04-25 DIAGNOSIS — R1310 Dysphagia, unspecified: Secondary | ICD-10-CM | POA: Diagnosis not present

## 2015-04-25 DIAGNOSIS — Z79899 Other long term (current) drug therapy: Secondary | ICD-10-CM | POA: Diagnosis not present

## 2015-04-25 DIAGNOSIS — R197 Diarrhea, unspecified: Secondary | ICD-10-CM | POA: Insufficient documentation

## 2015-04-25 DIAGNOSIS — C787 Secondary malignant neoplasm of liver and intrahepatic bile duct: Secondary | ICD-10-CM | POA: Diagnosis not present

## 2015-04-25 DIAGNOSIS — C3410 Malignant neoplasm of upper lobe, unspecified bronchus or lung: Secondary | ICD-10-CM

## 2015-04-25 DIAGNOSIS — R000 Tachycardia, unspecified: Secondary | ICD-10-CM | POA: Diagnosis not present

## 2015-04-25 DIAGNOSIS — B370 Candidal stomatitis: Secondary | ICD-10-CM | POA: Insufficient documentation

## 2015-04-25 DIAGNOSIS — R270 Ataxia, unspecified: Secondary | ICD-10-CM

## 2015-04-25 DIAGNOSIS — C771 Secondary and unspecified malignant neoplasm of intrathoracic lymph nodes: Secondary | ICD-10-CM

## 2015-04-25 DIAGNOSIS — C3411 Malignant neoplasm of upper lobe, right bronchus or lung: Secondary | ICD-10-CM | POA: Insufficient documentation

## 2015-04-25 DIAGNOSIS — Z9221 Personal history of antineoplastic chemotherapy: Secondary | ICD-10-CM | POA: Diagnosis not present

## 2015-04-25 DIAGNOSIS — G939 Disorder of brain, unspecified: Secondary | ICD-10-CM

## 2015-04-25 DIAGNOSIS — G936 Cerebral edema: Secondary | ICD-10-CM | POA: Diagnosis not present

## 2015-04-25 DIAGNOSIS — R531 Weakness: Secondary | ICD-10-CM | POA: Insufficient documentation

## 2015-04-25 DIAGNOSIS — F17210 Nicotine dependence, cigarettes, uncomplicated: Secondary | ICD-10-CM | POA: Diagnosis not present

## 2015-04-25 DIAGNOSIS — R439 Unspecified disturbances of smell and taste: Secondary | ICD-10-CM

## 2015-04-25 DIAGNOSIS — Z923 Personal history of irradiation: Secondary | ICD-10-CM | POA: Diagnosis not present

## 2015-04-25 LAB — COMPREHENSIVE METABOLIC PANEL
ALK PHOS: 63 U/L (ref 38–126)
ALT: 24 U/L (ref 14–54)
ANION GAP: 9 (ref 5–15)
AST: 20 U/L (ref 15–41)
Albumin: 3.7 g/dL (ref 3.5–5.0)
BUN: 15 mg/dL (ref 6–20)
CO2: 27 mmol/L (ref 22–32)
Calcium: 8.9 mg/dL (ref 8.9–10.3)
Chloride: 97 mmol/L — ABNORMAL LOW (ref 101–111)
Creatinine, Ser: 0.62 mg/dL (ref 0.44–1.00)
Glucose, Bld: 86 mg/dL (ref 65–99)
POTASSIUM: 3 mmol/L — AB (ref 3.5–5.1)
SODIUM: 133 mmol/L — AB (ref 135–145)
TOTAL PROTEIN: 6.3 g/dL — AB (ref 6.5–8.1)
Total Bilirubin: 0.7 mg/dL (ref 0.3–1.2)

## 2015-04-25 LAB — CBC WITH DIFFERENTIAL/PLATELET
Basophils Absolute: 0 10*3/uL (ref 0–0.1)
Basophils Relative: 0 %
EOS ABS: 0 10*3/uL (ref 0–0.7)
EOS PCT: 0 %
HCT: 43.5 % (ref 35.0–47.0)
Hemoglobin: 14.8 g/dL (ref 12.0–16.0)
LYMPHS ABS: 0.3 10*3/uL — AB (ref 1.0–3.6)
Lymphocytes Relative: 3 %
MCH: 33.1 pg (ref 26.0–34.0)
MCHC: 34 g/dL (ref 32.0–36.0)
MCV: 97.3 fL (ref 80.0–100.0)
MONOS PCT: 3 %
Monocytes Absolute: 0.4 10*3/uL (ref 0.2–0.9)
Neutro Abs: 12.1 10*3/uL — ABNORMAL HIGH (ref 1.4–6.5)
Neutrophils Relative %: 94 %
PLATELETS: 181 10*3/uL (ref 150–440)
RBC: 4.47 MIL/uL (ref 3.80–5.20)
RDW: 16.1 % — ABNORMAL HIGH (ref 11.5–14.5)
WBC: 12.9 10*3/uL — ABNORMAL HIGH (ref 3.6–11.0)

## 2015-04-25 MED ORDER — HEPARIN SOD (PORK) LOCK FLUSH 100 UNIT/ML IV SOLN
500.0000 [IU] | Freq: Once | INTRAVENOUS | Status: AC
  Administered 2015-04-25: 500 [IU] via INTRAVENOUS
  Filled 2015-04-25: qty 5

## 2015-04-25 MED ORDER — SODIUM CHLORIDE 0.9 % IJ SOLN
10.0000 mL | Freq: Once | INTRAMUSCULAR | Status: AC
  Administered 2015-04-25: 10 mL via INTRAVENOUS
  Filled 2015-04-25: qty 10

## 2015-04-25 MED ORDER — DEXAMETHASONE 4 MG PO TABS: ORAL_TABLET | ORAL | Status: DC

## 2015-04-25 NOTE — Progress Notes (Signed)
Refill for lasix escribed to pharmacy. t  [place] @ [facility] Telephone:(336) [phone]  Fax:(336) [place]: [phone]  MR#: [id number]  OPF#:[id number]  Patient Care Team: Adin Hector, MD as PCP - General (Internal Medicine)  CHIEF COMPLAINT:  Chief Complaint  Patient presents with  . Lung Cancer    Oncology History   Non-small cell carcinoma of lung T1 N2 M1stage IV diseasePET scan is consistent with one small lesion in the T11 as well as suspected lesion in liver (diagnoses was in July of 2015) right upper lobe tumor with metastases to the left liver and mediastinal lymph node  MRI of liver conforms metastases to the liver.  MRI of thoracic spine raises possibility of metastases to the spine however this is inconclusive study.  (August, 2015) 2.  Patient was started on carboplatinum and Taxol started on November 25, 2013. 6 cycle of chemotherapy with carboplatinum and Taxol.  Last treatment was March 16, 2014 7.because of progressing disease on CT scan patient has been started on NIVO   in  February of 2016 8.  Patient is off NIVO because of diarrhea (June, 2016) 7.  Will be started on carboplatinum and gemcitabine from July, 19th, 2016   8.  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  Diagnosis by MRI scan of brain October of 2016 7.  Patient had stereotactic brain radiation therapy finished in October of 2016     INTERVAL HISTORY:  80 year old lady who has been diagnosed to have stage IV carcinoma off  Patient's condition is somewhat better. Still walking with the help of walker.  She does not have any taste in the mouth.  She was diagnosed to have candidiasis and was started on Diflucan.  Patient is taking 16 mg of Decadron and 1000 mg of Keppra.  She is sleeping all the time. No more seizure activity.  No headache.  Diplopia is better.  Balance is improved. Patient is gradually cutting down in the dose of steroid is on 4 mg daily.  Patient  continues to have difficulty maintaining balance.  Family desires help at home.  Had MRI scan of the brain which has been reviewed independently.  He is here for further evaluation and planning of Treatment   REVIEW OF SYSTEMS:   GENERAL:  Feeling week tired.  Difficulty maintaining balance.  In wheelchair. PERFORMANCE STATUS (ECOG):  01 HEENT: Diplopia Lungs: No shortness of breath or cough.  No hemoptysis. Cardiac:  No chest pain, palpitations, orthopnea, or PND. GI: Diarrhea is improving Abdominal discomfort.  No blood or mucus in the stool.  Watery stool 2 or 3 a day.  No chills.  No fever. GU:  No urgency, frequency, dysuria, or hematuria. Musculoskeletal:  No back pain.  No joint pain.  No muscle tenderness. Extremities:  No pain or swelling. Skin:  No rashes or skin changes. Neurological system: Progressive symptoms as described about after taking it milligram of Decadron patient has noticed some improvement in headache Endocrine:  No diabetes, thyroid issues, hot flashes or night sweats. Psych:  No mood changes, depression or anxiety. Pain:  No focal pain. Review of systems:  All other systems reviewed and found to be negative.  As per HPI. Otherwise, a complete review of systems is negatve.  PAST MEDICAL HISTORY: Past Medical History  Diagnosis Date  . Lung cancer ([place]) non small cell  . Lung cancer ([place])     PAST SURGICAL HISTORY: No significant past surgical  history other than mentioned below FAMILY HISTORY There is no significant family history of breast cancer, ovarian cancer, colon cancer Smoking History 0.5(1)60 years(1)  PFSH: Comments: family history of colon cancer cancer of uterus and history of hypertension and heart disease  Social History: positive tobacco  Smoker: Smoking cessation counseling performed  Smoker- Packs Per Day: 0.5  Smoker- How Many Years: 92-  Additional Past Medical and Surgical History: As mentioned above         ADVANCED  DIRECTIVES: Patient does not have any advanced healthcare directive. Information has been given.   HEALTH MAINTENANCE: Social History  Substance Use Topics  . Smoking status: Current Every Day Smoker -- 0.50 packs/day for 60 years  . Smokeless tobacco: None  . Alcohol Use: None       Allergies  Allergen Reactions  . Codeine Other (See Comments) and Nausea Only    GI Distress  . Hydrochlorothiazide Other (See Comments)    hypercalcemia  . Prednisone Swelling      OBJECTIVE:  There were no vitals filed for this visit.   There is no weight on file to calculate BMI.    ECOG FS:1 - Symptomatic but completely ambulatory  PHYSICAL EXAM: Gen. status: Patient is in wheelchair.  HEENT: Diplopia.  Continues to have difficulty swallowing.Lymphatic system: Supraclavicular, cervical, axillary, inguinal lymph nodes are not palpable.  Lungs: Diminished air entry on both sides.  Cardiac: Tachycardia  GI: No nausea no vomiting no diarrhea GU: No dysuria or hematuria Skin: No rash Neurological system: Cerebellar ataxia.  Diplopia. Musculoskeletal system within normal limit All other systems have been examined and no abnormality detected .LAB RESULTS:  Infusion on 04/25/2015  Component Date Value Ref Range Status  . WBC 04/25/2015 12.9* 3.6 - 11.0 K/uL Final  . RBC 04/25/2015 4.47  3.80 - 5.20 MIL/uL Final  . Hemoglobin 04/25/2015 14.8  12.0 - 16.0 g/dL Final  . HCT 04/25/2015 43.5  35.0 - 47.0 % Final  . MCV 04/25/2015 97.3  80.0 - 100.0 fL Final  . MCH 04/25/2015 33.1  26.0 - 34.0 pg Final  . MCHC 04/25/2015 34.0  32.0 - 36.0 g/dL Final  . RDW 04/25/2015 16.1* 11.5 - 14.5 % Final  . Platelets 04/25/2015 181  150 - 440 K/uL Final  . Neutrophils Relative % 04/25/2015 94   Final  . Neutro Abs 04/25/2015 12.1* 1.4 - 6.5 K/uL Final  . Lymphocytes Relative 04/25/2015 3   Final  . Lymphs Abs 04/25/2015 0.3* 1.0 - 3.6 K/uL Final  . Monocytes Relative 04/25/2015 3   Final  . Monocytes  Absolute 04/25/2015 0.4  0.2 - 0.9 K/uL Final  . Eosinophils Relative 04/25/2015 0   Final  . Eosinophils Absolute 04/25/2015 0.0  0 - 0.7 K/uL Final  . Basophils Relative 04/25/2015 0   Final  . Basophils Absolute 04/25/2015 0.0  0 - 0.1 K/uL Final  . Sodium 04/25/2015 133* 135 - 145 mmol/L Final  . Potassium 04/25/2015 3.0* 3.5 - 5.1 mmol/L Final  . Chloride 04/25/2015 97* 101 - 111 mmol/L Final  . CO2 04/25/2015 27  22 - 32 mmol/L Final  . Glucose, Bld 04/25/2015 86  65 - 99 mg/dL Final  . BUN 04/25/2015 15  6 - 20 mg/dL Final  . Creatinine, Ser 04/25/2015 0.62  0.44 - 1.00 mg/dL Final  . Calcium 04/25/2015 8.9  8.9 - 10.3 mg/dL Final  . Total Protein 04/25/2015 6.3* 6.5 - 8.1 g/dL Final  . Albumin 04/25/2015 3.7  3.5 - 5.0 g/dL Final  . AST 04/25/2015 20  15 - 41 U/L Final  . ALT 04/25/2015 24  14 - 54 U/L Final  . Alkaline Phosphatase 04/25/2015 63  38 - 126 U/L Final  . Total Bilirubin 04/25/2015 0.7  0.3 - 1.2 mg/dL Final  . GFR calc non Af Amer 04/25/2015 >60  >60 mL/min Final  . GFR calc Af Amer 04/25/2015 >60  >60 mL/min Final   Comment: (NOTE) The eGFR has been calculated using the CKD EPI equation. This calculation has not been validated in all clinical situations. eGFR's persistently <60 mL/min signify possible Chronic Kidney Disease.   . Anion gap 04/25/2015 9  5 - 15 Final      STUDIES: IMPRESSION: 1. 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 The fourth ventricle remains patent and there is no significant intracranial mass effect at this time. 2. No other metastatic disease identified. 3. 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  Electronically Signed: By: Genevie Ann M.D. 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  All lab data has been reviewed Repeat MRI scan has been reviewed independently  and reviewed with the patient and family Shows some improvement in the swelling Some of the symptoms have improved.  I had prolonged discussion with patient and family they are not very keen on receiving any systemic therapy.  Reassessment of the tumor with CT scan of the chest and abdomen being planned. Patient and family also wants a occupational and physical therapy at home to see if management of patient becomes slightly easier.. Total duration of visit was 30  minutes.  50% or more time was spent in counseling patient and family regarding prognosis and options of treatment and available resources        Cancer of lung, upper lobe   Staging form: Lung, AJCC 7th Edition     Clinical: Stage IV (T1, N2, M1b) - Marni Griffon, MD   04/25/2015 11:06 AM

## 2015-04-27 ENCOUNTER — Encounter: Payer: Self-pay | Admitting: Oncology

## 2015-05-04 ENCOUNTER — Telehealth: Payer: Self-pay | Admitting: *Deleted

## 2015-05-04 NOTE — Telephone Encounter (Signed)
Per Dr Jeb Levering, increased Steroids to bid and come see him tomorrow or Monday if no better. I called and spoke with Gregary Signs at [employer] and if we send an order for nursing, they can go out today. Order signed by Dr Oliva Bustard. I spoke with Shirlean Mylar and let her know of med change which she repeated back to me and that [facility] can open her to services today for nursing. She thanked me for my assistance in this

## 2015-05-04 NOTE — Telephone Encounter (Addendum)
Unable to stand/walk now, dizziness, headache, bil LE swelling. All sx are worse. Her steroids have been decreased, Asking if she needs to be seen, or if her steroids need to be increased. Does not think she can get her in the car to get her into office. [facility] is supposed to be opening her to services on Friday and asking if that can be moved up also. Is having a ramp built, but unavailable right now so getting gher out is almost impossible

## 2015-05-11 ENCOUNTER — Telehealth: Payer: Self-pay | Admitting: *Deleted

## 2015-05-11 DIAGNOSIS — C3410 Malignant neoplasm of upper lobe, unspecified bronchus or lung: Secondary | ICD-10-CM

## 2015-05-11 DIAGNOSIS — C787 Secondary malignant neoplasm of liver and intrahepatic bile duct: Secondary | ICD-10-CM

## 2015-05-11 MED ORDER — FUROSEMIDE 20 MG PO TABS: 20.0000 mg | ORAL_TABLET | Freq: Every day | ORAL | Status: DC

## 2015-05-11 NOTE — Telephone Encounter (Signed)
Called for refill on Lasix and to see if we have completed her FMLA. Notified that FMLA was faxed yesterday and rx sent to pharmacy

## 2015-05-13 ENCOUNTER — Ambulatory Visit
Admission: RE | Admit: 2015-05-13 | Discharge: 2015-05-13 | Disposition: A | Discharge status: Home / Self Care | Payer: Medicare Other | Source: Ambulatory Visit | Attending: Oncology | Admitting: Oncology

## 2015-05-13 DIAGNOSIS — K8020 Calculus of gallbladder without cholecystitis without obstruction: Secondary | ICD-10-CM | POA: Insufficient documentation

## 2015-05-13 DIAGNOSIS — C3410 Malignant neoplasm of upper lobe, unspecified bronchus or lung: Secondary | ICD-10-CM | POA: Diagnosis not present

## 2015-05-13 DIAGNOSIS — R918 Other nonspecific abnormal finding of lung field: Secondary | ICD-10-CM | POA: Insufficient documentation

## 2015-05-13 HISTORY — DX: Essential (primary) hypertension: I10

## 2015-05-13 MED ORDER — IOHEXOL 300 MG/ML  SOLN
100.0000 mL | Freq: Once | INTRAMUSCULAR | Status: AC | PRN
  Administered 2015-05-13: 100 mL via INTRAVENOUS

## 2015-05-16 ENCOUNTER — Inpatient Hospital Stay: Payer: Medicare Other

## 2015-05-16 ENCOUNTER — Encounter: Payer: Self-pay | Admitting: Oncology

## 2015-05-16 ENCOUNTER — Inpatient Hospital Stay: Payer: Medicare Other | Attending: Oncology | Admitting: Oncology

## 2015-05-16 VITALS — BP 134/84 | HR 105 | Temp 96.3°F | Resp 18 | Wt 167.6 lb

## 2015-05-16 DIAGNOSIS — G936 Cerebral edema: Secondary | ICD-10-CM

## 2015-05-16 DIAGNOSIS — R531 Weakness: Secondary | ICD-10-CM | POA: Insufficient documentation

## 2015-05-16 DIAGNOSIS — Z9221 Personal history of antineoplastic chemotherapy: Secondary | ICD-10-CM

## 2015-05-16 DIAGNOSIS — R5383 Other fatigue: Secondary | ICD-10-CM | POA: Insufficient documentation

## 2015-05-16 DIAGNOSIS — C787 Secondary malignant neoplasm of liver and intrahepatic bile duct: Secondary | ICD-10-CM

## 2015-05-16 DIAGNOSIS — H532 Diplopia: Secondary | ICD-10-CM | POA: Diagnosis not present

## 2015-05-16 DIAGNOSIS — C7931 Secondary malignant neoplasm of brain: Secondary | ICD-10-CM

## 2015-05-16 DIAGNOSIS — R197 Diarrhea, unspecified: Secondary | ICD-10-CM

## 2015-05-16 DIAGNOSIS — C771 Secondary and unspecified malignant neoplasm of intrathoracic lymph nodes: Secondary | ICD-10-CM

## 2015-05-16 DIAGNOSIS — R270 Ataxia, unspecified: Secondary | ICD-10-CM

## 2015-05-16 DIAGNOSIS — R51 Headache: Secondary | ICD-10-CM

## 2015-05-16 DIAGNOSIS — C3411 Malignant neoplasm of upper lobe, right bronchus or lung: Secondary | ICD-10-CM

## 2015-05-16 DIAGNOSIS — F17210 Nicotine dependence, cigarettes, uncomplicated: Secondary | ICD-10-CM | POA: Insufficient documentation

## 2015-05-16 DIAGNOSIS — Z923 Personal history of irradiation: Secondary | ICD-10-CM

## 2015-05-16 DIAGNOSIS — R600 Localized edema: Secondary | ICD-10-CM | POA: Insufficient documentation

## 2015-05-16 DIAGNOSIS — Z79899 Other long term (current) drug therapy: Secondary | ICD-10-CM | POA: Diagnosis not present

## 2015-05-16 DIAGNOSIS — C3410 Malignant neoplasm of upper lobe, unspecified bronchus or lung: Secondary | ICD-10-CM

## 2015-05-16 DIAGNOSIS — T66XXXS Radiation sickness, unspecified, sequela: Secondary | ICD-10-CM

## 2015-05-16 DIAGNOSIS — I10 Essential (primary) hypertension: Secondary | ICD-10-CM | POA: Insufficient documentation

## 2015-05-16 LAB — CBC WITH DIFFERENTIAL/PLATELET
BASOS ABS: 0.1 10*3/uL (ref 0–0.1)
Basophils Relative: 1 %
EOS ABS: 0 10*3/uL (ref 0–0.7)
EOS PCT: 0 %
HCT: 44.4 % (ref 35.0–47.0)
HEMOGLOBIN: 15.3 g/dL (ref 12.0–16.0)
LYMPHS ABS: 0.8 10*3/uL — AB (ref 1.0–3.6)
Lymphocytes Relative: 4 %
MCH: 33.4 pg (ref 26.0–34.0)
MCHC: 34.4 g/dL (ref 32.0–36.0)
MCV: 97.2 fL (ref 80.0–100.0)
Monocytes Absolute: 1.1 10*3/uL — ABNORMAL HIGH (ref 0.2–0.9)
Monocytes Relative: 6 %
NEUTROS PCT: 89 %
Neutro Abs: 15.9 10*3/uL — ABNORMAL HIGH (ref 1.4–6.5)
PLATELETS: 209 10*3/uL (ref 150–440)
RBC: 4.57 MIL/uL (ref 3.80–5.20)
RDW: 16.4 % — ABNORMAL HIGH (ref 11.5–14.5)
WBC: 17.8 10*3/uL — AB (ref 3.6–11.0)

## 2015-05-16 LAB — COMPREHENSIVE METABOLIC PANEL
ALK PHOS: 67 U/L (ref 38–126)
ALT: 26 U/L (ref 14–54)
AST: 24 U/L (ref 15–41)
Albumin: 4.2 g/dL (ref 3.5–5.0)
Anion gap: 10 (ref 5–15)
BUN: 29 mg/dL — AB (ref 6–20)
CALCIUM: 9.3 mg/dL (ref 8.9–10.3)
CHLORIDE: 95 mmol/L — AB (ref 101–111)
CO2: 26 mmol/L (ref 22–32)
CREATININE: 0.75 mg/dL (ref 0.44–1.00)
GFR calc non Af Amer: >60 mL/min (ref >60)
GLUCOSE: 99 mg/dL (ref 65–99)
Potassium: 4.9 mmol/L (ref 3.5–5.1)
SODIUM: 131 mmol/L — AB (ref 135–145)
Total Bilirubin: 0.6 mg/dL (ref 0.3–1.2)
Total Protein: 6.5 g/dL (ref 6.5–8.1)

## 2015-05-16 LAB — MAGNESIUM: Magnesium: 1.9 mg/dL (ref 1.7–2.4)

## 2015-05-16 MED ORDER — DEXAMETHASONE 4 MG PO TABS: ORAL_TABLET | ORAL | Status: AC

## 2015-05-16 NOTE — Progress Notes (Signed)
t  [place] @ [facility] [phone]  Fax:([phone] [place]: 23-Sep-1935  MR#: [id number]  YKZ#:[id number]  Patient Care Team: Adin Hector, MD as PCP - General (Internal Medicine)  CHIEF COMPLAINT:  Chief Complaint  Patient presents with  . Lung Cancer    Oncology History   Non-small cell carcinoma of lung T1 N2 M1stage IV diseasePET scan is consistent with one small lesion in the T11 as well as suspected lesion in liver (diagnoses was in July of 2015) right upper lobe tumor with metastases to the left liver and mediastinal lymph node  MRI of liver conforms metastases to the liver.  MRI of thoracic spine raises possibility of metastases to the spine however this is inconclusive study.  (August, 2015) 2.  Patient was started on carboplatinum and Taxol started on November 25, 2013. 6 cycle of chemotherapy with carboplatinum and Taxol.  Last treatment was March 16, 2014 7.because of progressing disease on CT scan patient has been started on NIVO   in  February of 2016 8.  Patient is off NIVO because of diarrhea (June, 2016) 7.  Will be started on carboplatinum and gemcitabine from July, 19th, 2016   8.  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  Diagnosis by MRI scan of brain October of 2016 7.  Patient had stereotactic brain radiation therapy finished in October of 2016   Oncology Flowsheet 11/24/2014 12/08/2014 12/29/2014 01/05/2015 02/09/2015 02/16/2015 03/09/2015  Day, Cycle Day 8, Cycle 2 Day 1, Cycle 3 Day 1, Cycle 4 Day 8, Cycle 4 Day 1, Cycle 5 Day 8, Cycle 5 -  CARBOplatin (PARAPLATIN) IV - 390 mg 390 mg - 390 mg - -  dexamethasone (DECADRON) IV - [ 12 mg ] [ 12 mg ] [ 10 mg ] [ 12 mg ] [ 10 mg ] [ 20 mg ]  fosaprepitant (EMEND) IV - [ 150 mg ] [ 150 mg ] - [ 150 mg ] - -  gemcitabine (GEMZAR) IV 1,000 mg/m2 1,000 mg/m2 1,000 mg/m2 1,000 mg/m2 1,000 mg/m2 988.5932 mg -  LORazepam (ATIVAN) IV - - 0.5 mg - - - -  nivolumab (OPDIVO) IV - - - - - - -    ondansetron (ZOFRAN) IV [ 8 mg ] - - [ 8 mg ] - [ 8 mg ] [ 8 mg ]  palonosetron (ALOXI) IV - 0.25 mg 0.25 mg - 0.25 mg - -    INTERVAL HISTORY:  80 year old lady who has been diagnosed to have stage IV carcinoma of lung with metastases to the brain She continues to have diplopia and difficulty maintaining balance. Recent MRI revealed vasogenic edema in the brainstem area at the previous site of radiation therapy On steroid.  Decadron 8 mg daily Since condition continues to decline.  While Decadron was tapered C could not walk and became very confused and disoriented.  Steroid was increased to 8 mg daily. Continues to have weakness and difficulty in ambulation Retaining fluid gaining weight intermittent headache Repeat CT scan was done    REVIEW OF SYSTEMS:   GENERAL:  Feeling week tired.  Difficulty maintaining balance.  In wheelchair. PERFORMANCE STATUS (ECOG):  01 HEENT: Diplopia Lungs: No shortness of breath or cough.  No hemoptysis. Cardiac:  No chest pain, palpitations, orthopnea, or PND. GI: Diarrhea is improving Abdominal discomfort.  No blood or mucus in the stool.  Watery stool 2 or 3 a day.  No chills.  No fever.  GU:  No urgency, frequency, dysuria, or hematuria. Musculoskeletal:  No back pain.  No joint pain.  No muscle tenderness. Extremities:  No pain or swelling. Skin:  No rashes or skin changes. Neurological system: Progressive symptoms as described about after taking it milligram of Decadron patient has noticed some improvement in headache Endocrine:  No diabetes, thyroid issues, hot flashes or night sweats. Psych:  No mood changes, depression or anxiety. Pain:  No focal pain. Review of systems:  All other systems reviewed and found to be negative.  As per HPI. Otherwise, a complete review of systems is negatve.  PAST MEDICAL HISTORY: Past Medical History  Diagnosis Date  . Lung cancer (HCC) non small cell  . Lung cancer (HCC)   . Hypertension     PAST  SURGICAL HISTORY: No significant past surgical history other than mentioned below FAMILY HISTORY There is no significant family history of breast cancer, ovarian cancer, colon cancer Smoking History 0.5(1)60 years(1)  PFSH: Comments: family history of colon cancer cancer of uterus and history of hypertension and heart disease  Social History: positive tobacco  Smoker: Smoking cessation counseling performed  Smoker- Packs Per Day: 0.5  Smoker- How Many Years: 60-  Additional Past Medical and Surgical History: As mentioned above         ADVANCED DIRECTIVES: Patient does not have any advanced healthcare directive. Information has been given.   HEALTH MAINTENANCE: Social History  Substance Use Topics  . Smoking status: Current Every Day Smoker -- 0.50 packs/day for 60 years  . Smokeless tobacco: Not on file  . Alcohol Use: Not on file       Allergies  Allergen Reactions  . Codeine Other (See Comments) and Nausea Only    GI Distress  . Hydrochlorothiazide Other (See Comments)    hypercalcemia  . Prednisone Swelling      OBJECTIVE:  Filed Vitals:   05/16/15 0959  BP: 134/84  Pulse: 105  Temp: 96.3 F (35.7 C)  Resp: 18     Body mass index is 29.69 kg/(m^2).    ECOG FS:1 - Symptomatic but completely ambulatory  PHYSICAL EXAM: Gen. status: Patient is in wheelchair.  HEENT: Diplopia.  Continues to have difficulty swallowing.Lymphatic system: Supraclavicular, cervical, axillary, inguinal lymph nodes are not palpable.  Lungs: Diminished air entry on both sides.  Cardiac: Tachycardia  GI: No nausea no vomiting no diarrhea GU: No dysuria or hematuria Skin: No rash Neurological system: Cerebellar ataxia.  Diplopia. Musculoskeletal system within normal limit All other systems have been examined and no abnormality detected .LAB RESULTS:  Appointment on 05/16/2015  Component Date Value Ref Range Status  . WBC 05/16/2015 17.8* 3.6 - 11.0 K/uL Final  . RBC 05/16/2015  4.57  3.80 - 5.20 MIL/uL Final  . Hemoglobin 05/16/2015 15.3  12.0 - 16.0 g/dL Final  . HCT 05/16/2015 44.4  35.0 - 47.0 % Final  . MCV 05/16/2015 97.2  80.0 - 100.0 fL Final  . MCH 05/16/2015 33.4  26.0 - 34.0 pg Final  . MCHC 05/16/2015 34.4  32.0 - 36.0 g/dL Final  . RDW 05/16/2015 16.4* 11.5 - 14.5 % Final  . Platelets 05/16/2015 209  150 - 440 K/uL Final  . Neutrophils Relative % 05/16/2015 89   Final  . Neutro Abs 05/16/2015 15.9* 1.4 - 6.5 K/uL Final  . Lymphocytes Relative 05/16/2015 4   Final  . Lymphs Abs 05/16/2015 0.8* 1.0 - 3.6 K/uL Final  . Monocytes Relative 05/16/2015 6   Final  .   Monocytes Absolute 05/16/2015 1.1* 0.2 - 0.9 K/uL Final  . Eosinophils Relative 05/16/2015 0   Final  . Eosinophils Absolute 05/16/2015 0.0  0 - 0.7 K/uL Final  . Basophils Relative 05/16/2015 1   Final  . Basophils Absolute 05/16/2015 0.1  0 - 0.1 K/uL Final  . Sodium 05/16/2015 131* 135 - 145 mmol/L Final  . Potassium 05/16/2015 4.9  3.5 - 5.1 mmol/L Final  . Chloride 05/16/2015 95* 101 - 111 mmol/L Final  . CO2 05/16/2015 26  22 - 32 mmol/L Final  . Glucose, Bld 05/16/2015 99  65 - 99 mg/dL Final  . BUN 05/16/2015 29* 6 - 20 mg/dL Final  . Creatinine, Ser 05/16/2015 0.75  0.44 - 1.00 mg/dL Final  . Calcium 05/16/2015 9.3  8.9 - 10.3 mg/dL Final  . Total Protein 05/16/2015 6.5  6.5 - 8.1 g/dL Final  . Albumin 05/16/2015 4.2  3.5 - 5.0 g/dL Final  . AST 05/16/2015 24  15 - 41 U/L Final  . ALT 05/16/2015 26  14 - 54 U/L Final  . Alkaline Phosphatase 05/16/2015 67  38 - 126 U/L Final  . Total Bilirubin 05/16/2015 0.6  0.3 - 1.2 mg/dL Final  . GFR calc non Af Amer 05/16/2015 >60  >60 mL/min Final  . GFR calc Af Amer 05/16/2015 >60  >60 mL/min Final   Comment: (NOTE) The eGFR has been calculated using the CKD EPI equation. This calculation has not been validated in all clinical situations. eGFR's persistently <60 mL/min signify possible Chronic Kidney Disease.   . Anion gap 05/16/2015 10   5 - 15 Final  . Magnesium 05/16/2015 1.9  1.7 - 2.4 mg/dL Final      STUDIES: IMPRESSION: 1. 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 The fourth ventricle remains patent and there is no significant intracranial mass effect at this time. 2. No other metastatic disease identified. 3. 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  Electronically Signed: By: Genevie Ann M.D. On: 01/12/2015 16:14 ASSESSMENT: 1.stage IV carcinoma of lung (non-small cell type) Progressive weakness possibility of radiation necrosis versus metastases to the brain Consider use of Avastin for possibility of radiation necrosis Consider protocol for radiation necrosis Increase steroid Discussed all these options with the patient.  CT scan of the chest has been reviewed independently and reviewed with the patient There is increasing right upper lobe mass  Total duration of visit was 45 minutes.  50% or more time was spent in counseling patient and family regarding prognosis and options of treatment and available resource  available protocol outside institution for radiation necrosis.  Pros and cons of side effect of Avastinavailable palliative care option 2.swelling of both lower extremity Use fluid pill 3 times a week Repeat check lab     Cancer of lung, upper lobe   Staging form: Lung, AJCC 7th Edition     Clinical: Stage IV (T1, N2, M1b) - Marni Griffon, MD   05/16/2015 10:27 AM

## 2015-05-16 NOTE — Progress Notes (Signed)
Patient here today for CT results.  Now taking 2 steroids a day.  Bilateral lower extremity edema.  Patient continues to be dizzy.

## 2015-05-23 ENCOUNTER — Telehealth: Payer: Self-pay | Admitting: *Deleted

## 2015-05-23 DIAGNOSIS — C787 Secondary malignant neoplasm of liver and intrahepatic bile duct: Secondary | ICD-10-CM

## 2015-05-23 DIAGNOSIS — C3410 Malignant neoplasm of upper lobe, unspecified bronchus or lung: Secondary | ICD-10-CM

## 2015-05-23 MED ORDER — FUROSEMIDE 20 MG PO TABS: 40.0000 mg | ORAL_TABLET | Freq: Every day | ORAL | Status: DC

## 2015-05-23 NOTE — Telephone Encounter (Signed)
Increase lasix to 40 mg daily. Vicky notified

## 2015-05-23 NOTE — Telephone Encounter (Signed)
Having diarrhea and they are giving her Imodium for that, but she had 3+ pitting edema in her lower extremities Friday when seen, and the daughter called adn reports that is much worse today. She is currently on 20 mg of Lasix and are asking if that can be increased

## 2015-05-26 ENCOUNTER — Other Ambulatory Visit: Payer: Self-pay | Admitting: *Deleted

## 2015-05-26 DIAGNOSIS — C787 Secondary malignant neoplasm of liver and intrahepatic bile duct: Secondary | ICD-10-CM

## 2015-05-26 DIAGNOSIS — C3410 Malignant neoplasm of upper lobe, unspecified bronchus or lung: Secondary | ICD-10-CM

## 2015-05-26 MED ORDER — FUROSEMIDE 20 MG PO TABS: 40.0000 mg | ORAL_TABLET | Freq: Every day | ORAL | Status: AC

## 2015-05-30 ENCOUNTER — Telehealth: Payer: Self-pay | Admitting: *Deleted

## 2015-05-30 NOTE — Telephone Encounter (Signed)
faxed

## 2015-05-31 ENCOUNTER — Telehealth: Payer: Self-pay | Admitting: *Deleted

## 2015-05-31 ENCOUNTER — Inpatient Hospital Stay: Payer: Medicare Other

## 2015-05-31 ENCOUNTER — Inpatient Hospital Stay: Payer: Medicare Other | Admitting: Oncology

## 2015-05-31 NOTE — Telephone Encounter (Signed)
Colletta Maryland called to report that pt might have been experiencing seizure-like activity. Pt's daughter noticed that pt started to become limp and unresponsive for a few seconds at a time. Was questioning if keppra could be increased. Also, stephanie needs clarification on decadron prescription. Per Dr. Oliva Bustard, maximum dose of keppra is '500mg'$  BID and that decadron should be '4mg'$  BID. Colletta Maryland with hospice verbalized understanding.

## 2015-06-02 ENCOUNTER — Telehealth: Payer: Self-pay | Admitting: *Deleted

## 2015-06-02 NOTE — Telephone Encounter (Signed)
Having rhonchi throughout all lobes bil, having SOB, non productive cough, and is unable to do her inhalers. Asking for OK to order DuoNeb SVN. Order approved.

## 2015-06-06 ENCOUNTER — Other Ambulatory Visit: Payer: Self-pay | Admitting: *Deleted

## 2015-06-06 MED ORDER — LEVETIRACETAM 500 MG PO TABS: 500.0000 mg | ORAL_TABLET | Freq: Two times a day (BID) | ORAL | Status: AC

## 2015-06-08 ENCOUNTER — Inpatient Hospital Stay: Payer: Medicare Other

## 2015-06-08 ENCOUNTER — Inpatient Hospital Stay: Payer: Medicare Other | Admitting: Oncology

## 2015-06-21 ENCOUNTER — Other Ambulatory Visit: Payer: Self-pay | Admitting: *Deleted

## 2015-06-21 MED ORDER — MORPHINE SULFATE (CONCENTRATE) 20 MG/ML PO SOLN: ORAL | Status: AC

## 2015-07-09 DEATH — deceased

## 2017-02-02 IMAGING — MR MR HEAD WO/W CM
12 of 13 series · 42 of 48 positions shown · IV contrast (15 ML MULTIHANCE)
Comparison: PET-CT 12/27/2014, 10/13/2013

ADDENDUM:
Study discussed by telephone with Dr. LEGO PICHUGIN on 01/12/2015 at
9593 hours.
CLINICAL DATA: 79-year-old female with right facial numbness for 4
days. Currently undergoing chemotherapy for lung cancer. Subsequent
encounter.

EXAM:
MRI HEAD WITHOUT AND WITH CONTRAST
TECHNIQUE: Multiplanar, multiecho pulse sequences of the brain and surrounding
structures were obtained without and with intravenous contrast.
CONTRAST:  15 mL MultiHance

[Series 2: T1 · sagittal · 5.0mm · 0.45mm/px · 2 of 29 slices shown]
[im 1/29]
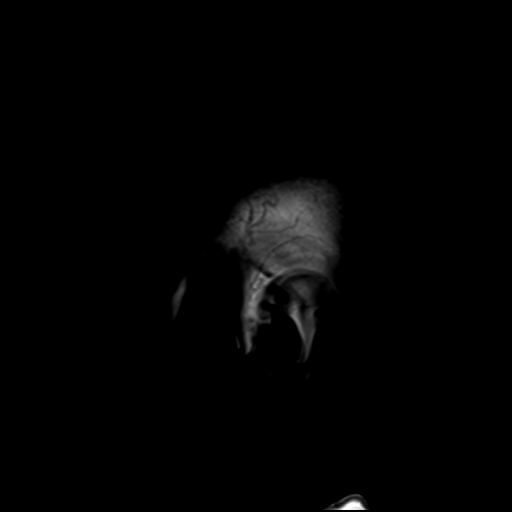
[im 15/29]
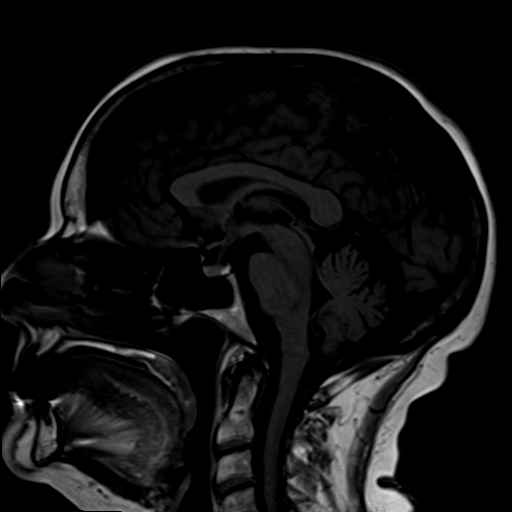

[Series 4: DWI · axial · 3.0mm · 1.80mm/px · z∈[-52,+109]mm · 5 of 55 slices shown (1 of 4)]
[im 1/55]
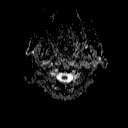
[im 14/55]
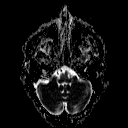
[im 28/55]
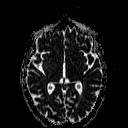
[im 41/55]
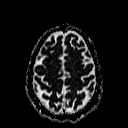
[im 55/55]
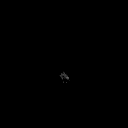

[Series 6: DWI · coronal · 3.0mm · 1.80mm/px · 5 of 52 slices shown (2 of 4)]
[im 1/52]
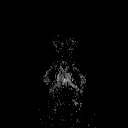
[im 13/52]
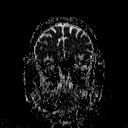
[im 26/52]
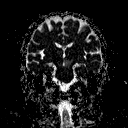
[im 39/52]
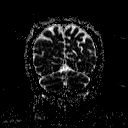
[im 52/52]
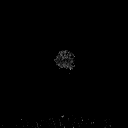

[Series 7: T2 · axial · 5.0mm · 0.60mm/px · z∈[-52,+110]mm · 2 of 26 slices shown (1 of 2)]
[im 1/26]
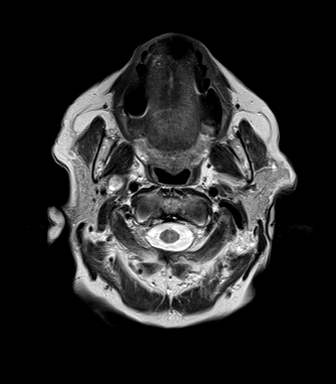
[im 26/26]
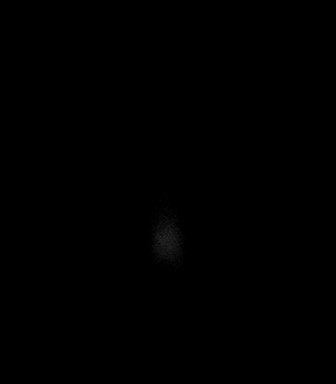

[Series 8: FLAIR · axial · 5.0mm · 0.45mm/px · z∈[-53,+110]mm · 2 of 26 slices shown]
[im 1/26]
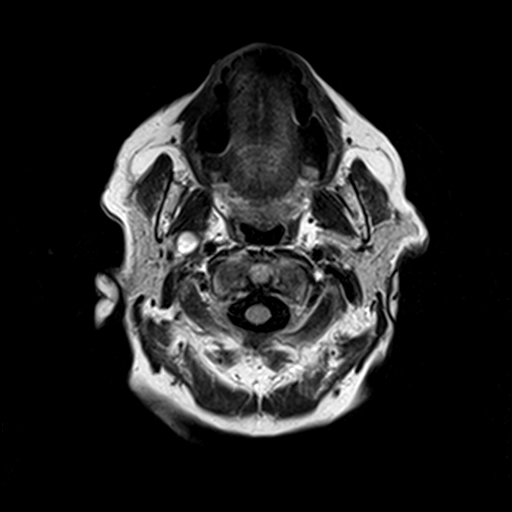
[im 26/26]
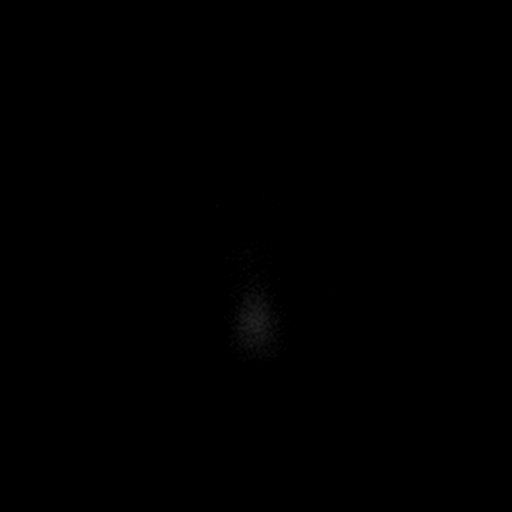

[Series 9: T2 · axial · 5.0mm · 0.45mm/px · z∈[-52,+110]mm · 2 of 26 slices shown (2 of 2)]
[im 1/26]
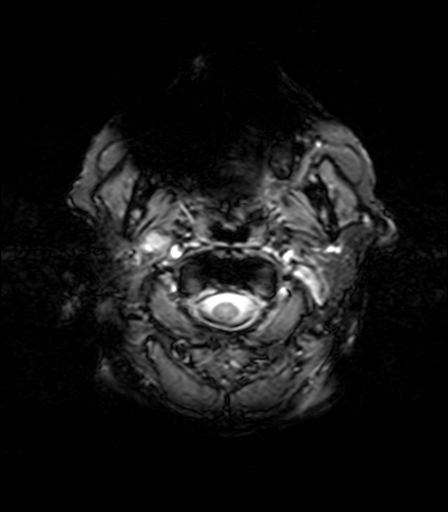
[im 26/26]
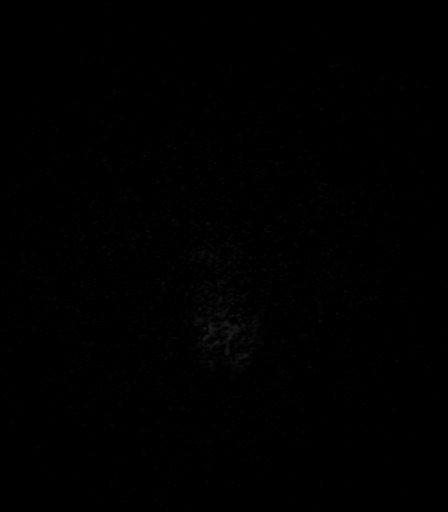

[Series 11: T2 post-contrast · coronal · 5.0mm · 0.49mm/px · 3 of 31 slices shown]
[im 1/31]
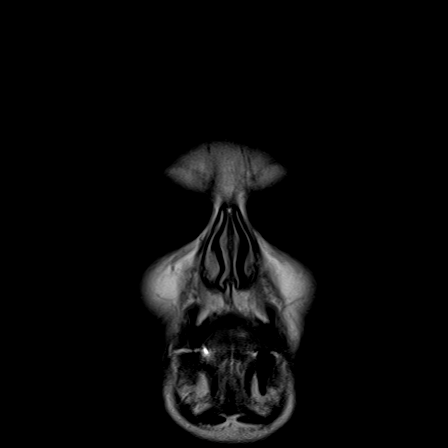
[im 16/31]
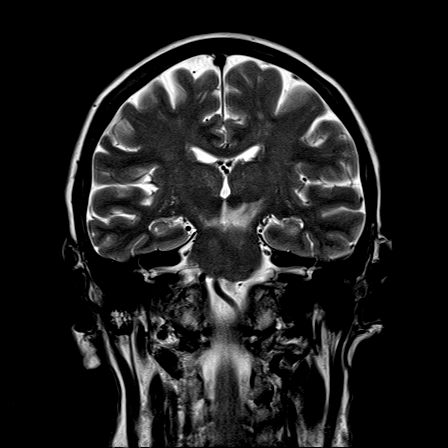
[im 31/31]
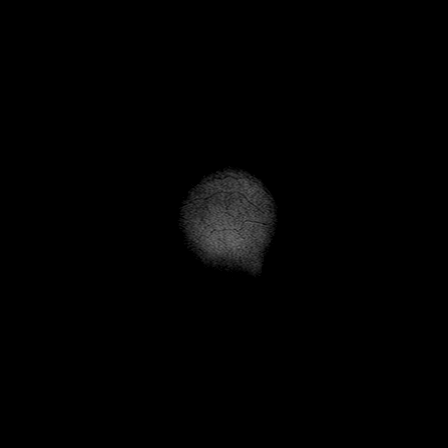

[Series 12: T1 post-contrast · axial · 3.0mm · 1.00mm/px · z∈[-53,+112]mm · 5 of 56 slices shown (1 of 3)]
[im 1/56]
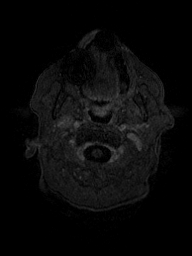
[im 14/56]
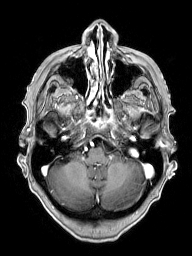
[im 28/56]
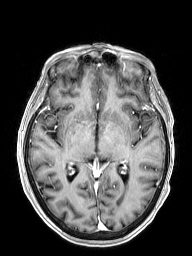
[im 42/56]
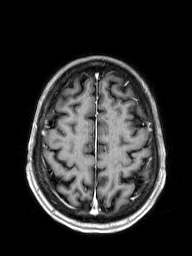
[im 56/56]
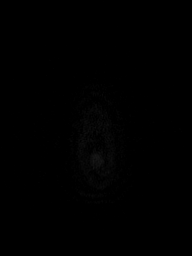

[Series 13: T1 post-contrast · coronal · 5.0mm · 0.43mm/px · 3 of 31 slices shown (2 of 3)]
[im 1/31]
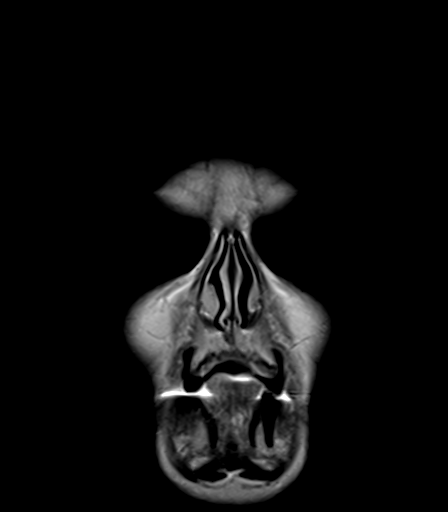
[im 16/31]
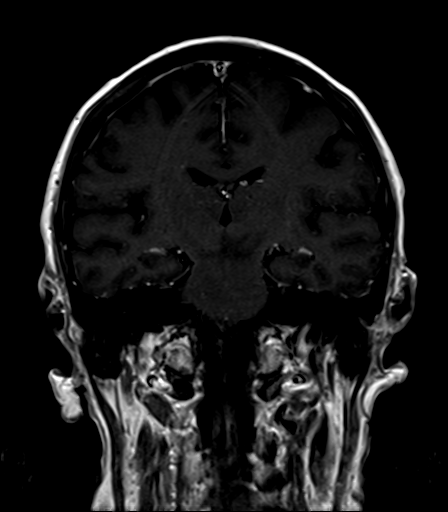
[im 31/31]
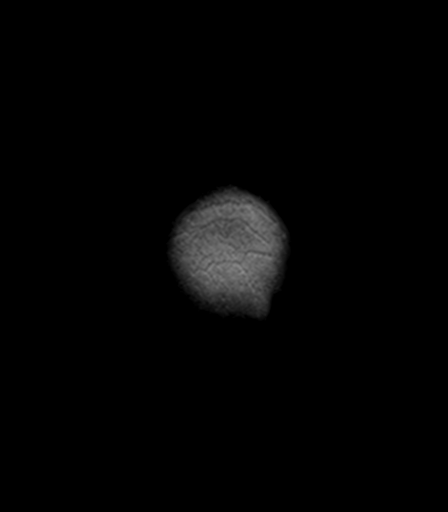

[Series 14: T1 post-contrast · sagittal · 5.0mm · 0.45mm/px · 3 of 29 slices shown (3 of 3)]
[im 1/29]
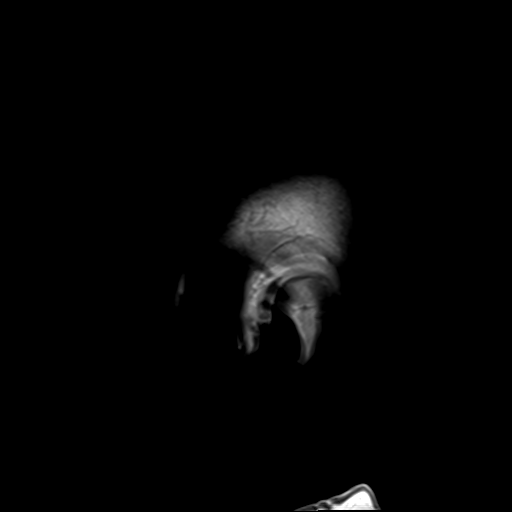
[im 15/29]
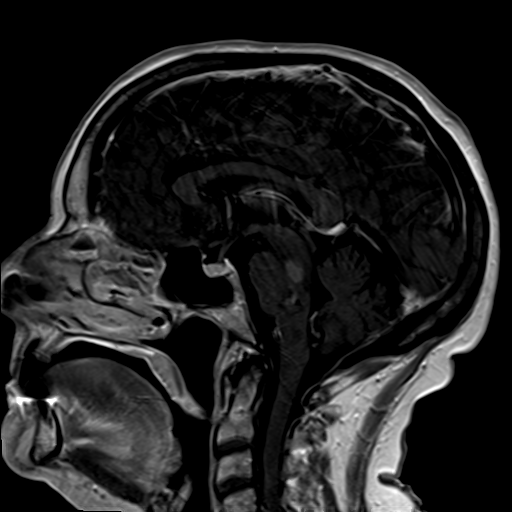
[im 29/29]
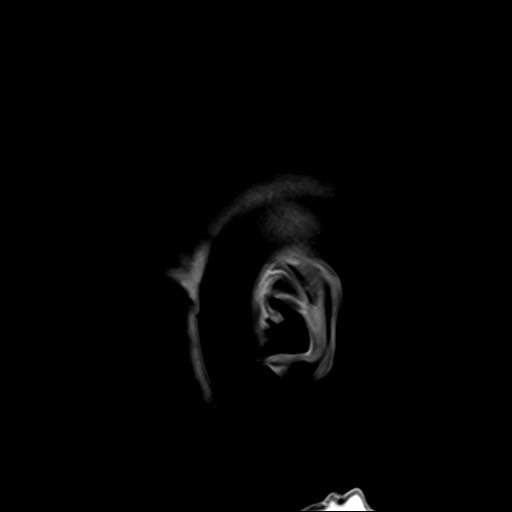

[Series 100: DWI · axial · 3.0mm · 1.80mm/px · z∈[-52,+109]mm · 5 of 55 slices shown (3 of 4)]
[im 1/55]
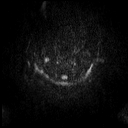
[im 14/55]
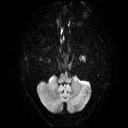
[im 28/55]
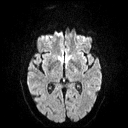
[im 41/55]
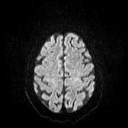
[im 55/55]
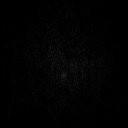

[Series 101: DWI · coronal · 3.0mm · 1.80mm/px · 5 of 52 slices shown (4 of 4)]
[im 1/52]
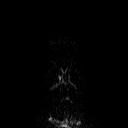
[im 13/52]
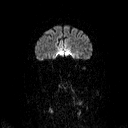
[im 26/52]
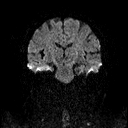
[im 39/52]
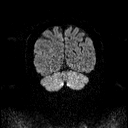
[im 52/52]
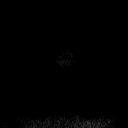

[42 of 48 positions shown; findings below may reference images not displayed]

FINDINGS: Solid, enhancing round metastasis in the dorsal left brainstem at
the level of the pons measuring 13 mm diameter with surrounding
brainstem edema which tracks across midline, cephalad toward the
left cerebral peduncle, and caudal partially involving the left
cerebellar peduncles.

No other brain metastasis identified. Subtle developmental venous
anomaly in the posterior right frontal lobe on series 14, image 6.
No dural thickening identified.

The fourth ventricle remains patent. No ventriculomegaly. No
significant intracranial mass effect at this time. No acute
intracranial hemorrhage identified.

No superimposed restricted diffusion or evidence of acute
infarction. Major intracranial vascular flow voids are preserved
with a mild degree of intracranial artery dolichoectasia. Patchy
nonspecific cerebral white matter T2 and FLAIR hyperintensity is
mild for age. No cortical encephalomalacia. Negative pituitary.
Negative cervicomedullary junction and visualized cervical spine.
Heterogeneous T1 bone marrow signal in the calvarium, but no
destructive osseous lesion identified.

Trace right mastoid effusion. Other Visible internal auditory
structures appear normal.

There is a circumscribed, round cystic lesion in the right
parapharyngeal space abutting the right pterygoid muscles measuring
15 mm diameter (series 7, image 2) which does not definitely enhance
and has intrinsic T1 hyperintensity. This was present in 7469 and it
appears unchanged since that time (series 3, image 25 on
10/13/2013). This does have some diffusion changes (series 101,
image 34.

Other face soft tissues appear within normal limits. Postoperative
changes to the left globe. Negative paranasal sinuses.
IMPRESSION: 1. Left brainstem metastasis, 13 mm diameter with moderate brainstem
edema. The fourth ventricle remains patent and there is no
significant intracranial mass effect at this time.
2. No other metastatic disease identified.
3. Chronic 15 mm right parapharyngeal space mass. Favor a benign
mixed tumor of the deep lobe of the parotid gland.
